# Patient Record
Sex: Female | Born: 1980 | Race: White | Hispanic: No | Marital: Married | Smoking: Never smoker
Health system: Southern US, Community
[De-identification: ages and names within clinical notes are randomized; demographics above are authoritative.]

## PROBLEM LIST (undated history)

## (undated) DIAGNOSIS — R768 Other specified abnormal immunological findings in serum: Secondary | ICD-10-CM

## (undated) DIAGNOSIS — R894 Abnormal immunological findings in specimens from other organs, systems and tissues: Secondary | ICD-10-CM

## (undated) DIAGNOSIS — T4145XA Adverse effect of unspecified anesthetic, initial encounter: Secondary | ICD-10-CM

## (undated) DIAGNOSIS — K219 Gastro-esophageal reflux disease without esophagitis: Secondary | ICD-10-CM

## (undated) DIAGNOSIS — R87629 Unspecified abnormal cytological findings in specimens from vagina: Secondary | ICD-10-CM

## (undated) DIAGNOSIS — Z8759 Personal history of other complications of pregnancy, childbirth and the puerperium: Secondary | ICD-10-CM

## (undated) DIAGNOSIS — T8859XA Other complications of anesthesia, initial encounter: Secondary | ICD-10-CM

## (undated) DIAGNOSIS — D649 Anemia, unspecified: Secondary | ICD-10-CM

## (undated) HISTORY — DX: Unspecified abnormal cytological findings in specimens from vagina: R87.629

## (undated) HISTORY — PX: BREAST LUMPECTOMY: SHX2

## (undated) HISTORY — DX: Personal history of other complications of pregnancy, childbirth and the puerperium: Z87.59

## (undated) HISTORY — PX: EXTERNAL EAR SURGERY: SHX627

## (undated) HISTORY — PX: LEEP: SHX91

---

## 2014-12-27 NOTE — L&D Delivery Note (Signed)
Delivery Note At 8:17 AM a viable female was delivered via Vaginal, Spontaneous Delivery (Presentation: Left Occiput Anterior).  APGAR: 7, 9; weight  .  Pending. Nuchal x 1 reduced. Terminal Bradycardia.  Placenta status: Intact, Manual removal after 20 minutes with no spontaneous delivery and concern for cervical contraction.  Cord: 3VC  with the following complications: None.  Cord pH: not indicated. Some stretching of vaginal tissue but overall vagina intact. 1 small area with bleeding from small vessel, just inside introitus, controlled with single figure of 8 of 3-0 vicryl rapide. Aggressive uterine bleeding noted. Pitocin infusing.  Placenta examined, intact, uterus deep in pelvic and firm, straight cath with <50 cc urine. No temp. 25 mcg hemabate given, still bleeding at 5-10 min, 1000 mcg rectal cytotec placed. Vaginal wall retractors placed and cervix examined- no lacerations. After about 20 minutes, bleeding slowed.   Anesthesia: Epidural  Episiotomy: None Lacerations:  1st degree Suture Repair: 3.0 vicryl rapide Est. Blood Loss (mL):  700cc  Mom to postpartum.  Baby to Couplet care / Skin to Skin.  Jacorian Golaszewski A. 10/03/2015, 9:07 AM

## 2015-02-27 LAB — OB RESULTS CONSOLE ABO/RH: RH Type: NEGATIVE

## 2015-03-14 LAB — OB RESULTS CONSOLE GC/CHLAMYDIA
CHLAMYDIA, DNA PROBE: NEGATIVE
GC PROBE AMP, GENITAL: NEGATIVE

## 2015-03-14 LAB — OB RESULTS CONSOLE RPR: RPR: NONREACTIVE

## 2015-03-14 LAB — OB RESULTS CONSOLE HEPATITIS B SURFACE ANTIGEN: Hepatitis B Surface Ag: NEGATIVE

## 2015-03-14 LAB — OB RESULTS CONSOLE RUBELLA ANTIBODY, IGM: RUBELLA: IMMUNE

## 2015-03-14 LAB — OB RESULTS CONSOLE HIV ANTIBODY (ROUTINE TESTING): HIV: NONREACTIVE

## 2015-06-23 LAB — OB RESULTS CONSOLE ANTIBODY SCREEN: ANTIBODY SCREEN: NEGATIVE

## 2015-09-19 LAB — OB RESULTS CONSOLE GBS: GBS: NEGATIVE

## 2015-10-01 ENCOUNTER — Encounter (HOSPITAL_COMMUNITY): Payer: Self-pay

## 2015-10-01 ENCOUNTER — Inpatient Hospital Stay (HOSPITAL_COMMUNITY)
Admission: AD | Admit: 2015-10-01 | Discharge: 2015-10-05 | DRG: 774 | Disposition: A | Discharge status: Home / Self Care | Payer: BC Managed Care – PPO | Source: Ambulatory Visit | Attending: Obstetrics | Admitting: Obstetrics

## 2015-10-01 DIAGNOSIS — Z3A39 39 weeks gestation of pregnancy: Secondary | ICD-10-CM

## 2015-10-01 DIAGNOSIS — Z3490 Encounter for supervision of normal pregnancy, unspecified, unspecified trimester: Secondary | ICD-10-CM

## 2015-10-01 DIAGNOSIS — K219 Gastro-esophageal reflux disease without esophagitis: Secondary | ICD-10-CM | POA: Diagnosis present

## 2015-10-01 DIAGNOSIS — O9962 Diseases of the digestive system complicating childbirth: Secondary | ICD-10-CM | POA: Diagnosis present

## 2015-10-01 DIAGNOSIS — D62 Acute posthemorrhagic anemia: Secondary | ICD-10-CM | POA: Diagnosis not present

## 2015-10-01 DIAGNOSIS — O9081 Anemia of the puerperium: Secondary | ICD-10-CM | POA: Diagnosis not present

## 2015-10-01 DIAGNOSIS — O134 Gestational [pregnancy-induced] hypertension without significant proteinuria, complicating childbirth: Principal | ICD-10-CM | POA: Diagnosis present

## 2015-10-01 HISTORY — DX: Other complications of anesthesia, initial encounter: T88.59XA

## 2015-10-01 HISTORY — DX: Anemia, unspecified: D64.9

## 2015-10-01 HISTORY — DX: Adverse effect of unspecified anesthetic, initial encounter: T41.45XA

## 2015-10-01 HISTORY — DX: Gastro-esophageal reflux disease without esophagitis: K21.9

## 2015-10-01 LAB — CBC
HEMATOCRIT: 35.3 % — AB (ref 36.0–46.0)
HEMOGLOBIN: 12.2 g/dL (ref 12.0–15.0)
MCH: 31.4 pg (ref 26.0–34.0)
MCHC: 34.6 g/dL (ref 30.0–36.0)
MCV: 90.7 fL (ref 78.0–100.0)
Platelets: 237 10*3/uL (ref 150–400)
RBC: 3.89 MIL/uL (ref 3.87–5.11)
RDW: 13.2 % (ref 11.5–15.5)
WBC: 10.7 10*3/uL — ABNORMAL HIGH (ref 4.0–10.5)

## 2015-10-01 LAB — COMPREHENSIVE METABOLIC PANEL
ALK PHOS: 253 U/L — AB (ref 38–126)
ALT: 17 U/L (ref 14–54)
AST: 28 U/L (ref 15–41)
Albumin: 3 g/dL — ABNORMAL LOW (ref 3.5–5.0)
Anion gap: 6 (ref 5–15)
BILIRUBIN TOTAL: 0.5 mg/dL (ref 0.3–1.2)
BUN: 12 mg/dL (ref 6–20)
CALCIUM: 8.5 mg/dL — AB (ref 8.9–10.3)
CO2: 20 mmol/L — AB (ref 22–32)
CREATININE: 1.1 mg/dL — AB (ref 0.44–1.00)
Chloride: 110 mmol/L (ref 101–111)
GFR calc Af Amer: >60 mL/min (ref >60)
GFR calc non Af Amer: >60 mL/min (ref >60)
GLUCOSE: 80 mg/dL (ref 65–99)
Potassium: 4.2 mmol/L (ref 3.5–5.1)
SODIUM: 136 mmol/L (ref 135–145)
Total Protein: 6.2 g/dL — ABNORMAL LOW (ref 6.5–8.1)

## 2015-10-01 LAB — URIC ACID: Uric Acid, Serum: 8.6 mg/dL — ABNORMAL HIGH (ref 2.3–6.6)

## 2015-10-01 MED ORDER — LACTATED RINGERS IV SOLN
INTRAVENOUS | Status: DC
  Administered 2015-10-01: 125 mL/h via INTRAVENOUS
  Administered 2015-10-01 – 2015-10-02 (×2): via INTRAVENOUS
  Administered 2015-10-02: 125 mL/h via INTRAVENOUS

## 2015-10-01 MED ORDER — LACTATED RINGERS IV SOLN
500.0000 mL | INTRAVENOUS | Status: DC | PRN
  Administered 2015-10-01 – 2015-10-02 (×2): 500 mL via INTRAVENOUS

## 2015-10-01 MED ORDER — ONDANSETRON HCL 4 MG/2ML IJ SOLN
4.0000 mg | Freq: Four times a day (QID) | INTRAMUSCULAR | Status: DC | PRN
  Administered 2015-10-02: 4 mg via INTRAVENOUS
  Filled 2015-10-01: qty 2

## 2015-10-01 MED ORDER — OXYCODONE-ACETAMINOPHEN 5-325 MG PO TABS: 1.0000 | ORAL_TABLET | ORAL | Status: DC | PRN

## 2015-10-01 MED ORDER — OXYTOCIN BOLUS FROM INFUSION
500.0000 mL | INTRAVENOUS | Status: DC
  Administered 2015-10-03 (×2): 500 mL via INTRAVENOUS

## 2015-10-01 MED ORDER — LIDOCAINE HCL (PF) 1 % IJ SOLN
30.0000 mL | INTRAMUSCULAR | Status: DC | PRN
  Filled 2015-10-01: qty 30

## 2015-10-01 MED ORDER — FLEET ENEMA 7-19 GM/118ML RE ENEM: 1.0000 | ENEMA | RECTAL | Status: DC | PRN

## 2015-10-01 MED ORDER — ACETAMINOPHEN 325 MG PO TABS: 650.0000 mg | ORAL_TABLET | ORAL | Status: DC | PRN

## 2015-10-01 MED ORDER — CITRIC ACID-SODIUM CITRATE 334-500 MG/5ML PO SOLN
30.0000 mL | ORAL | Status: DC | PRN
  Administered 2015-10-02: 30 mL via ORAL
  Filled 2015-10-01: qty 15

## 2015-10-01 MED ORDER — MISOPROSTOL 25 MCG QUARTER TABLET
25.0000 ug | ORAL_TABLET | ORAL | Status: DC
  Administered 2015-10-01 (×2): 25 ug via VAGINAL
  Administered 2015-10-03: 1000 ug via VAGINAL
  Filled 2015-10-01 (×2): qty 0.25

## 2015-10-01 MED ORDER — OXYCODONE-ACETAMINOPHEN 5-325 MG PO TABS: 2.0000 | ORAL_TABLET | ORAL | Status: DC | PRN

## 2015-10-01 MED ORDER — OXYTOCIN 40 UNITS IN LACTATED RINGERS INFUSION - SIMPLE MED
62.5000 mL/h | INTRAVENOUS | Status: DC
  Administered 2015-10-03: 62.5 mL/h via INTRAVENOUS

## 2015-10-01 NOTE — H&P (Signed)
Tiffany Edwards is a 34 y.o. female presenting for IOL for gestational HTN.  Maternal Medical History:  Contractions: Onset was less than 1 hour ago.   Frequency: rare.   Perceived severity is mild.    Fetal activity: Perceived fetal activity is normal.   Last perceived fetal movement was within the past hour.    Prenatal complications: PIH.   Prenatal Complications - Diabetes: none.    OB History    Gravida Para Term Preterm AB TAB SAB Ectopic Multiple Living   2 0   1  1        Past Medical History  Diagnosis Date  . Anemia   . GERD (gastroesophageal reflux disease)   . Complication of anesthesia     reported tachycardia   Past Surgical History  Procedure Laterality Date  . Breast lumpectomy Right    Family History: family history is not on file. Social History:  reports that she has never smoked. She does not have any smokeless tobacco history on file. Her alcohol and drug histories are not on file.   Prenatal Transfer Tool  Maternal Diabetes: No Genetic Screening: Normal Maternal Ultrasounds/Referrals: Normal Fetal Ultrasounds or other Referrals:  None Maternal Substance Abuse:  No Significant Maternal Medications:  None Significant Maternal Lab Results:  None Other Comments:  None  Review of Systems  HENT: Negative.   Gastrointestinal: Negative.   Neurological: Negative.   All other systems reviewed and are negative.   Dilation: Closed Effacement (%): Thick Station: -3 Exam by:: Susy Manor, RN  Blood pressure 137/85, pulse 81, temperature 99.1 F (37.3 C), temperature source Oral, resp. rate 18, height  (1.6 m), weight 72.576 kg (160 lb), last menstrual period 12/28/2014. Maternal Exam:  Uterine Assessment: Contraction strength is mild.  Contraction frequency is rare.   Abdomen: Patient reports no abdominal tenderness. Fetal presentation: vertex  Introitus: Normal vulva. Normal vagina.  Ferning test: not done.  Nitrazine test: not  done. Amniotic fluid character: not assessed.  Pelvis: adequate for delivery.   Cervix: Cervix evaluated by digital exam.     Physical Exam  Nursing note and vitals reviewed. Constitutional: She is oriented to person, place, and time. She appears well-developed and well-nourished.  Neck: Normal range of motion. Neck supple.  Cardiovascular: Normal rate and regular rhythm.   Respiratory: Effort normal and breath sounds normal.  GI: Soft. Bowel sounds are normal.  Genitourinary: Vagina normal and uterus normal.  Musculoskeletal: Normal range of motion.  Neurological: She is alert and oriented to person, place, and time. She has normal reflexes.  Skin: Skin is warm and dry.  Psychiatric: She has a normal mood and affect. Her behavior is normal.    Prenatal labs: ABO, Rh: --/--/B NEG (10/05 1345) Antibody: POS (10/05 1345) Rubella: Immune (03/18 0000) RPR: Nonreactive (03/18 0000)  HBsAg: Negative (03/18 0000)  HIV: Non-reactive (03/18 0000)  GBS: Negative (09/23 0000)   CBC    Component Value Date/Time   WBC 10.7* 10/01/2015 1345   RBC 3.89 10/01/2015 1345   HGB 12.2 10/01/2015 1345   HCT 35.3* 10/01/2015 1345   PLT 237 10/01/2015 1345   MCV 90.7 10/01/2015 1345   MCH 31.4 10/01/2015 1345   MCHC 34.6 10/01/2015 1345   RDW 13.2 10/01/2015 1345    CMP     Component Value Date/Time   NA 136 10/01/2015 1345   K 4.2 10/01/2015 1345   CL 110 10/01/2015 1345   CO2 20* 10/01/2015 1345  GLUCOSE 80 10/01/2015 1345   BUN 12 10/01/2015 1345   CREATININE 1.10* 10/01/2015 1345   CALCIUM 8.5* 10/01/2015 1345   PROT 6.2* 10/01/2015 1345   ALBUMIN 3.0* 10/01/2015 1345   AST 28 10/01/2015 1345   ALT 17 10/01/2015 1345   ALKPHOS 253* 10/01/2015 1345   BILITOT 0.5 10/01/2015 1345   GFRNONAA >60 10/01/2015 1345   GFRAA >60 10/01/2015 1345    Reactive NST cytotec placed  Assessment/Plan: Gestational HTN- nl lab IOL   Luvina Poirier J 10/01/2015, 9:32 PM

## 2015-10-02 ENCOUNTER — Inpatient Hospital Stay (HOSPITAL_COMMUNITY): Payer: BC Managed Care – PPO | Admitting: Anesthesiology

## 2015-10-02 LAB — CBC
HCT: 34.7 % — ABNORMAL LOW (ref 36.0–46.0)
HEMOGLOBIN: 11.8 g/dL — AB (ref 12.0–15.0)
MCH: 31.2 pg (ref 26.0–34.0)
MCHC: 34 g/dL (ref 30.0–36.0)
MCV: 91.8 fL (ref 78.0–100.0)
Platelets: 205 10*3/uL (ref 150–400)
RBC: 3.78 MIL/uL — AB (ref 3.87–5.11)
RDW: 13.4 % (ref 11.5–15.5)
WBC: 11.9 10*3/uL — ABNORMAL HIGH (ref 4.0–10.5)

## 2015-10-02 LAB — COMPREHENSIVE METABOLIC PANEL
ALK PHOS: 235 U/L — AB (ref 38–126)
ALT: 18 U/L (ref 14–54)
ANION GAP: 6 (ref 5–15)
AST: 31 U/L (ref 15–41)
Albumin: 2.9 g/dL — ABNORMAL LOW (ref 3.5–5.0)
BILIRUBIN TOTAL: 1 mg/dL (ref 0.3–1.2)
BUN: 9 mg/dL (ref 6–20)
CALCIUM: 8.4 mg/dL — AB (ref 8.9–10.3)
CO2: 23 mmol/L (ref 22–32)
CREATININE: 1.01 mg/dL — AB (ref 0.44–1.00)
Chloride: 106 mmol/L (ref 101–111)
Glucose, Bld: 79 mg/dL (ref 65–99)
Potassium: 4.1 mmol/L (ref 3.5–5.1)
SODIUM: 135 mmol/L (ref 135–145)
TOTAL PROTEIN: 6.5 g/dL (ref 6.5–8.1)

## 2015-10-02 LAB — RPR: RPR: NONREACTIVE

## 2015-10-02 MED ORDER — TERBUTALINE SULFATE 1 MG/ML IJ SOLN: 0.2500 mg | Freq: Once | INTRAMUSCULAR | Status: DC | PRN

## 2015-10-02 MED ORDER — OXYTOCIN 40 UNITS IN LACTATED RINGERS INFUSION - SIMPLE MED
1.0000 m[IU]/min | INTRAVENOUS | Status: DC
  Administered 2015-10-02: 2 m[IU]/min via INTRAVENOUS
  Administered 2015-10-02: 8 m[IU]/min via INTRAVENOUS
  Administered 2015-10-02: 4 m[IU]/min via INTRAVENOUS
  Administered 2015-10-02: 6 m[IU]/min via INTRAVENOUS
  Filled 2015-10-02 (×2): qty 1000

## 2015-10-02 MED ORDER — PANTOPRAZOLE SODIUM 40 MG IV SOLR
40.0000 mg | Freq: Once | INTRAVENOUS | Status: AC
  Administered 2015-10-02: 40 mg via INTRAVENOUS
  Filled 2015-10-02: qty 40

## 2015-10-02 MED ORDER — PHENYLEPHRINE 40 MCG/ML (10ML) SYRINGE FOR IV PUSH (FOR BLOOD PRESSURE SUPPORT)
80.0000 ug | PREFILLED_SYRINGE | INTRAVENOUS | Status: DC | PRN
  Filled 2015-10-02: qty 20

## 2015-10-02 MED ORDER — LIDOCAINE HCL (PF) 1 % IJ SOLN
INTRAMUSCULAR | Status: DC | PRN
  Administered 2015-10-02 (×2): 4 mL via EPIDURAL
  Administered 2015-10-02: 2 mL via EPIDURAL

## 2015-10-02 MED ORDER — FENTANYL 2.5 MCG/ML BUPIVACAINE 1/10 % EPIDURAL INFUSION (WH - ANES)
14.0000 mL/h | INTRAMUSCULAR | Status: DC | PRN
  Administered 2015-10-02: 12 mL/h via EPIDURAL
  Administered 2015-10-02 (×2): 14 mL/h via EPIDURAL
  Filled 2015-10-02 (×3): qty 125

## 2015-10-02 MED ORDER — EPHEDRINE 5 MG/ML INJ: 10.0000 mg | INTRAVENOUS | Status: DC | PRN

## 2015-10-02 MED ORDER — FAMOTIDINE 20 MG PO TABS
20.0000 mg | ORAL_TABLET | Freq: Once | ORAL | Status: AC
  Administered 2015-10-02: 20 mg via ORAL
  Filled 2015-10-02: qty 1

## 2015-10-02 MED ORDER — BUPIVACAINE HCL (PF) 0.25 % IJ SOLN: INTRAMUSCULAR | Status: DC | PRN

## 2015-10-02 MED ORDER — DIPHENHYDRAMINE HCL 50 MG/ML IJ SOLN: 12.5000 mg | INTRAMUSCULAR | Status: DC | PRN

## 2015-10-02 NOTE — Anesthesia Preprocedure Evaluation (Signed)
Anesthesia Evaluation  Patient identified by MRN, date of birth, ID band Patient awake    Reviewed: Allergy & Precautions, H&P , NPO status , Patient's Chart, lab work & pertinent test results  Airway Mallampati: II  TM Distance: >3 FB Neck ROM: full    Dental no notable dental hx.    Pulmonary neg pulmonary ROS,    Pulmonary exam normal breath sounds clear to auscultation       Cardiovascular negative cardio ROS Normal cardiovascular exam Rhythm:regular Rate:Normal     Neuro/Psych negative neurological ROS  negative psych ROS   GI/Hepatic Neg liver ROS, GERD  ,  Endo/Other  negative endocrine ROS  Renal/GU negative Renal ROS  negative genitourinary   Musculoskeletal   Abdominal   Peds  Hematology  (+) anemia ,   Anesthesia Other Findings   Reproductive/Obstetrics (+) Pregnancy                             Anesthesia Physical Anesthesia Plan  ASA: II  Anesthesia Plan: Epidural   Post-op Pain Management:    Induction:   Airway Management Planned:   Additional Equipment:   Intra-op Plan:   Post-operative Plan:   Informed Consent: I have reviewed the patients History and Physical, chart, labs and discussed the procedure including the risks, benefits and alternatives for the proposed anesthesia with the patient or authorized representative who has indicated his/her understanding and acceptance.     Plan Discussed with:   Anesthesia Plan Comments:         Anesthesia Quick Evaluation

## 2015-10-02 NOTE — Progress Notes (Signed)
Tiffany Edwards is a 34 y.o. G2P0010 at [redacted]w[redacted]d by LMP admitted for induction of labor due to Hypertension.  Subjective: Still comfortable No HA  Objective: BP 126/63 mmHg  Pulse 59  Temp(Src) 97.4 F (36.3 C) (Oral)  Resp 18  Ht  (1.6 m)  Wt 72.576 kg (160 lb)  BMI 28.35 kg/m2  LMP 12/28/2014      FHT:  FHR: 155 bpm, variability: moderate,  accelerations:  Present,  decelerations:  Absent UC:   regular, every 2 minutes SVE:   Dilation: 1 Effacement (%): 50 Station: -3 Exam by:: MD Solaris Kram  Labs: Lab Results  Component Value Date   WBC 10.7* 10/01/2015   HGB 12.2 10/01/2015   HCT 35.3* 10/01/2015   MCV 90.7 10/01/2015   PLT 237 10/01/2015    Assessment / Plan: Induction of labor due to gestational hypertension,  progressing well on pitocin  Labor: Progressing normally and Progressing on Pitocin, will continue to increase then AROM Preeclampsia:  no signs or symptoms of toxicity, intake and ouput balanced and labs stable Fetal Wellbeing:  Category I Pain Control:  Labor support without medications I/D:  n/a Anticipated MOD:  NSVD  Clay Solum J 10/02/2015, 7:37 AM

## 2015-10-02 NOTE — Anesthesia Procedure Notes (Signed)
Epidural Patient location during procedure: OB  Staffing Anesthesiologist: Sandrika Schwinn Performed by: anesthesiologist   Preanesthetic Checklist Completed: patient identified, site marked, surgical consent, pre-op evaluation, timeout performed, IV checked, risks and benefits discussed and monitors and equipment checked  Epidural Patient position: sitting Prep: site prepped and draped and DuraPrep Patient monitoring: continuous pulse ox and blood pressure Approach: midline Location: L3-L4 Injection technique: LOR saline  Needle:  Needle type: Tuohy  Needle gauge: 17 G Needle length: 9 cm and 9 Needle insertion depth: 5 cm cm Catheter type: closed end flexible Catheter size: 19 Gauge Catheter at skin depth: 9 cm Test dose: negative  Assessment Events: blood not aspirated, injection not painful, no injection resistance, negative IV test and no paresthesia  Additional Notes Patient identified. Risks/Benefits/Options discussed with patient including but not limited to bleeding, infection, nerve damage, paralysis, failed block, incomplete pain control, headache, blood pressure changes, nausea, vomiting, reactions to medications, itching and postpartum back pain. Confirmed with bedside nurse the patient's most recent platelet count. Confirmed with patient that they are not currently taking any anticoagulation, have any bleeding history or any family history of bleeding disorders. Patient expressed understanding and wished to proceed. All questions were answered. Sterile technique was used throughout the entire procedure. Please see nursing notes for vital signs. Test dose was given through epidural catheter and negative prior to continuing to dose epidural or start infusion. Warning signs of high block given to the patient including shortness of breath, tingling/numbness in hands, complete motor block, or any concerning symptoms with instructions to call for help. Patient was given  instructions on fall risk and not to get out of bed. All questions and concerns addressed with instructions to call with any issues or inadequate analgesia.      

## 2015-10-02 NOTE — Progress Notes (Signed)
S: Doing well, no complaints, pain well controlled with epidural though pt now noting significant heartburn, not responding to po pepcid and bicitrate. No current HA.   O: BP 140/85 mmHg  Pulse 60  Temp(Src) 98 F (36.7 C) (Oral)  Resp 18  Ht  (1.6 m)  Wt 72.576 kg (160 lb)  BMI 28.35 kg/m2  SpO2 100%  LMP 12/28/2014   FHT:  FHR: 140s bpm, variability: moderate,  accelerations:  Present,  decelerations:  Absent UC:   regular, every 3 minutes pit at 20 munits/ min SVE:   Dilation: 2 Effacement (%): 50 Station: -3 Exam by:: dr Ernestina Penna  AROM clear   A / P:  34 y.o.  Obstetric History   G2   P0   T0   P0   A1   TAB0   SAB1   E0   M0   L0    at [redacted]w[redacted]d IOL for g est htn with HA, no current HA, bp's slightly elevated but overall stable. Cr up to 1.1 but down to 1.01 on HD#2  Fetal Wellbeing:  Category I Pain Control:  Epidural  Anticipated MOD:  NSVD  Tiffany Edwards A. 10/02/2015, 9:15 PM

## 2015-10-02 NOTE — Progress Notes (Signed)
S: Doing well, no complaints, pain well controlled with epidural. Pt notes no HA.  O: BP 120/84 mmHg  Pulse 85  Temp(Src) 98.3 F (36.8 C) (Oral)  Resp 18  Ht  (1.6 m)  Wt 72.576 kg (160 lb)  BMI 28.35 kg/m2  SpO2 100%  LMP 12/28/2014   FHT:  FHR: 130s bpm, variability: moderate,  accelerations:  Present,  decelerations:  Absent UC:   regular, every 3 minutes SVE:   Dilation: 2 Effacement (%): 50 Station: -3 Exam by:: Doloris Hall RN   A / P:  34 y.o.  Obstetric History   G2   P0   T0   P0   A1   TAB0   SAB1   E0   M0   L0    at [redacted]w[redacted]d IOL, gest htn, still latent phase, continue pitocin  Fetal Wellbeing:  Category I Pain Control:  Epidural  Anticipated MOD:  NSVD  Tiffany Edwards A. 10/02/2015, 5:03 PM

## 2015-10-02 NOTE — Progress Notes (Signed)
S: Doing well, no complaints, pain well controlled, not really feeling contractions, still latent phase, planning epidural. Pt notes resolution of HA since admission  O: BP 126/77 mmHg  Pulse 78  Temp(Src) 97.4 F (36.3 C) (Oral)  Resp 18  Ht  (1.6 m)  Wt 72.576 kg (160 lb)  BMI 28.35 kg/m2  LMP 12/28/2014   FHT:  FHR: 140s bpm, variability: moderate,  accelerations:  Present,  decelerations:  Absent UC:   regular, every 3 minutes SVE:   Dilation: 2 Effacement (%): 50 Station: -3 Exam by:: Dr. Ernestina Penna   A / P:  34 y.o.  Obstetric History   G2   P0   T0   P0   A1   TAB0   SAB1   E0   M0   L0    at [redacted]w[redacted]d IOL due to gestational htn (bp >140/90 x 2 in office) with HA and Cr 1.1  Fetal Wellbeing:  Category I Pain Control:  Labor support without medications  Anticipated MOD:  NSVD  Continue pitocin until active labor. AROM in active labor.   Dametri Ozburn A. 10/02/2015, 1:24 PM

## 2015-10-03 ENCOUNTER — Encounter (HOSPITAL_COMMUNITY): Payer: Self-pay

## 2015-10-03 LAB — COMPREHENSIVE METABOLIC PANEL
ALT: 17 U/L (ref 14–54)
AST: 43 U/L — ABNORMAL HIGH (ref 15–41)
Albumin: 2.1 g/dL — ABNORMAL LOW (ref 3.5–5.0)
Alkaline Phosphatase: 184 U/L — ABNORMAL HIGH (ref 38–126)
Anion gap: 8 (ref 5–15)
BUN: 10 mg/dL (ref 6–20)
CHLORIDE: 103 mmol/L (ref 101–111)
CO2: 20 mmol/L — ABNORMAL LOW (ref 22–32)
Calcium: 7.7 mg/dL — ABNORMAL LOW (ref 8.9–10.3)
Creatinine, Ser: 1.27 mg/dL — ABNORMAL HIGH (ref 0.44–1.00)
GFR, EST NON AFRICAN AMERICAN: 55 mL/min — AB (ref >60)
Glucose, Bld: 132 mg/dL — ABNORMAL HIGH (ref 65–99)
POTASSIUM: 3.6 mmol/L (ref 3.5–5.1)
Sodium: 131 mmol/L — ABNORMAL LOW (ref 135–145)
Total Bilirubin: 0.9 mg/dL (ref 0.3–1.2)
Total Protein: 5.1 g/dL — ABNORMAL LOW (ref 6.5–8.1)

## 2015-10-03 LAB — URIC ACID: URIC ACID, SERUM: 8.4 mg/dL — AB (ref 2.3–6.6)

## 2015-10-03 MED ORDER — LANOLIN HYDROUS EX OINT: TOPICAL_OINTMENT | CUTANEOUS | Status: DC | PRN

## 2015-10-03 MED ORDER — SIMETHICONE 80 MG PO CHEW
80.0000 mg | CHEWABLE_TABLET | ORAL | Status: DC | PRN
Start: 2015-10-03 — End: 2015-10-05

## 2015-10-03 MED ORDER — ONDANSETRON HCL 4 MG/2ML IJ SOLN: 4.0000 mg | INTRAMUSCULAR | Status: DC | PRN

## 2015-10-03 MED ORDER — FLEET ENEMA 7-19 GM/118ML RE ENEM: 1.0000 | ENEMA | Freq: Every day | RECTAL | Status: DC | PRN

## 2015-10-03 MED ORDER — ZOLPIDEM TARTRATE 5 MG PO TABS: 5.0000 mg | ORAL_TABLET | Freq: Every evening | ORAL | Status: DC | PRN

## 2015-10-03 MED ORDER — FAMOTIDINE 20 MG PO TABS
40.0000 mg | ORAL_TABLET | Freq: Every day | ORAL | Status: DC
  Filled 2015-10-03: qty 2

## 2015-10-03 MED ORDER — ACETAMINOPHEN 325 MG PO TABS: 650.0000 mg | ORAL_TABLET | ORAL | Status: DC | PRN

## 2015-10-03 MED ORDER — MISOPROSTOL 200 MCG PO TABS
ORAL_TABLET | ORAL | Status: AC
  Filled 2015-10-03: qty 5

## 2015-10-03 MED ORDER — SENNOSIDES-DOCUSATE SODIUM 8.6-50 MG PO TABS
2.0000 | ORAL_TABLET | ORAL | Status: DC
  Administered 2015-10-04 – 2015-10-05 (×2): 2 via ORAL
  Filled 2015-10-03 (×2): qty 2

## 2015-10-03 MED ORDER — MISOPROSTOL 200 MCG PO TABS: 1000.0000 ug | ORAL_TABLET | Freq: Once | ORAL | Status: DC

## 2015-10-03 MED ORDER — TETANUS-DIPHTH-ACELL PERTUSSIS 5-2.5-18.5 LF-MCG/0.5 IM SUSP: 0.5000 mL | Freq: Once | INTRAMUSCULAR | Status: DC

## 2015-10-03 MED ORDER — CARBOPROST TROMETHAMINE 250 MCG/ML IM SOLN
INTRAMUSCULAR | Status: AC
  Filled 2015-10-03: qty 1

## 2015-10-03 MED ORDER — SODIUM CHLORIDE 0.9 % IV SOLN
3.0000 g | Freq: Once | INTRAVENOUS | Status: AC
  Administered 2015-10-03: 3 g via INTRAVENOUS
  Filled 2015-10-03: qty 3

## 2015-10-03 MED ORDER — OXYCODONE-ACETAMINOPHEN 5-325 MG PO TABS: 2.0000 | ORAL_TABLET | ORAL | Status: DC | PRN

## 2015-10-03 MED ORDER — PRENATAL MULTIVITAMIN CH
1.0000 | ORAL_TABLET | Freq: Every day | ORAL | Status: DC
  Administered 2015-10-04: 1 via ORAL
  Filled 2015-10-03: qty 1

## 2015-10-03 MED ORDER — CARBOPROST TROMETHAMINE 250 MCG/ML IM SOLN
250.0000 ug | Freq: Once | INTRAMUSCULAR | Status: AC
  Administered 2015-10-03: 250 ug via INTRAMUSCULAR

## 2015-10-03 MED ORDER — BISACODYL 10 MG RE SUPP: 10.0000 mg | Freq: Every day | RECTAL | Status: DC | PRN

## 2015-10-03 MED ORDER — ONDANSETRON HCL 4 MG PO TABS: 4.0000 mg | ORAL_TABLET | ORAL | Status: DC | PRN

## 2015-10-03 MED ORDER — DIPHENHYDRAMINE HCL 25 MG PO CAPS: 25.0000 mg | ORAL_CAPSULE | Freq: Four times a day (QID) | ORAL | Status: DC | PRN

## 2015-10-03 MED ORDER — DIBUCAINE 1 % RE OINT: 1.0000 "application " | TOPICAL_OINTMENT | RECTAL | Status: DC | PRN

## 2015-10-03 MED ORDER — WITCH HAZEL-GLYCERIN EX PADS: 1.0000 "application " | MEDICATED_PAD | CUTANEOUS | Status: DC | PRN

## 2015-10-03 MED ORDER — OXYCODONE-ACETAMINOPHEN 5-325 MG PO TABS
1.0000 | ORAL_TABLET | ORAL | Status: DC | PRN
  Filled 2015-10-03: qty 1

## 2015-10-03 MED ORDER — IBUPROFEN 600 MG PO TABS
600.0000 mg | ORAL_TABLET | Freq: Four times a day (QID) | ORAL | Status: DC
  Administered 2015-10-03 – 2015-10-04 (×4): 600 mg via ORAL
  Filled 2015-10-03 (×4): qty 1

## 2015-10-03 MED ORDER — BENZOCAINE-MENTHOL 20-0.5 % EX AERO
1.0000 "application " | INHALATION_SPRAY | CUTANEOUS | Status: DC | PRN
  Filled 2015-10-03: qty 56

## 2015-10-03 NOTE — Lactation Note (Signed)
This note was copied from the chart of Tiffany Edwards. Lactation Consultation Note  Patient Name: Tiffany Edwards [id number] Date: 10/03/2015 Reason for consult: Initial assessment  Baby 10 hours old. Mom states baby has latched, but baby sleepy at breast. Enc mom to undress baby to nurse STS. Baby alert but fussy at breast. Baby willing to suckle this LC's gloved finger, but would not latch to breast. Demonstrated how to use pillows and get into football position. Baby interested in nuzzling and licking breast, but would not latch. Mom able to hand express colostrum. Discussed normal newborn behavior and enc mom to ask for assistance as baby cues to nurse. Enc offering lots of STS and attempts at breast. Mom given [facility] brochure, aware of OP/BFSG, community resources, and [facility] phone line assistance after D/C.  Maternal Data Has patient been taught Hand Expression?: Yes Does the patient have breastfeeding experience prior to this delivery?: No  Feeding Feeding Type: Breast Fed Length of feed: 5 min  LATCH Score/Interventions Latch: Too sleepy or reluctant, no latch achieved, no sucking elicited. Intervention(s): Skin to skin;Teach feeding cues;Waking techniques  Audible Swallowing: None Intervention(s): Skin to skin;Hand expression  Type of Nipple: Everted at rest and after stimulation  Comfort (Breast/Nipple): Soft / non-tender     Hold (Positioning): Assistance needed to correctly position infant at breast and maintain latch.  LATCH Score: 5  Lactation Tools Discussed/Used     Consult Status Consult Status: Follow-up Date: 10/04/15 Follow-up type: In-patient    Geralynn Ochs 10/03/2015, 6:44 PM

## 2015-10-03 NOTE — Progress Notes (Signed)
S: Doing well, no complaints, pain well controlled with epidural. No HA, some LE swelling, heartburn improved but still present.  O: BP 128/70 mmHg  Pulse 78  Temp(Src) 98.3 F (36.8 C) (Oral)  Resp 18  Ht  (1.6 m)  Wt 72.576 kg (160 lb)  BMI 28.35 kg/m2  SpO2 99%  LMP 12/28/2014   FHT:  FHR: 130s bpm, variability: moderate,  accelerations:  Present,  decelerations:  Absent UC:   regular, every 3 minutes SVE:   Dilation: 9 Effacement (%): 100 Station: 0 Exam by:: dr Ernestina Penna   A / P:  34 y.o.  Obstetric History   G2   P0   T0   P0   A1   TAB0   SAB1   E0   M0   L0    at [redacted]w[redacted]d IOL gest htn, bp's stable, no symptoms PEC at present. Now active labor, good progress, allow pasive descent, expect pushing in about 1 hr  Fetal Wellbeing:  Category I Pain Control:  Epidural  Anticipated MOD:  NSVD  Tiffany Edwards A. 10/03/2015, 1:50 AM

## 2015-10-04 DIAGNOSIS — D62 Acute posthemorrhagic anemia: Secondary | ICD-10-CM | POA: Diagnosis not present

## 2015-10-04 LAB — CBC
HEMATOCRIT: 23.6 % — AB (ref 36.0–46.0)
Hemoglobin: 8.2 g/dL — ABNORMAL LOW (ref 12.0–15.0)
MCH: 31.4 pg (ref 26.0–34.0)
MCHC: 34.7 g/dL (ref 30.0–36.0)
MCV: 90.4 fL (ref 78.0–100.0)
PLATELETS: 156 10*3/uL (ref 150–400)
RBC: 2.61 MIL/uL — ABNORMAL LOW (ref 3.87–5.11)
RDW: 13.5 % (ref 11.5–15.5)
WBC: 17 10*3/uL — ABNORMAL HIGH (ref 4.0–10.5)

## 2015-10-04 MED ORDER — POLYSACCHARIDE IRON COMPLEX 150 MG PO CAPS
150.0000 mg | ORAL_CAPSULE | Freq: Every day | ORAL | Status: DC
  Administered 2015-10-04: 150 mg via ORAL
  Filled 2015-10-04: qty 1

## 2015-10-04 MED ORDER — MAGNESIUM OXIDE 400 (241.3 MG) MG PO TABS
400.0000 mg | ORAL_TABLET | Freq: Every day | ORAL | Status: DC
  Administered 2015-10-04: 400 mg via ORAL
  Filled 2015-10-04 (×2): qty 1

## 2015-10-04 MED ORDER — RHO D IMMUNE GLOBULIN 1500 UNIT/2ML IJ SOSY
300.0000 ug | PREFILLED_SYRINGE | Freq: Once | INTRAMUSCULAR | Status: AC
  Administered 2015-10-04: 300 ug via INTRAVENOUS
  Filled 2015-10-04: qty 2

## 2015-10-04 MED ORDER — IBUPROFEN 800 MG PO TABS
800.0000 mg | ORAL_TABLET | Freq: Three times a day (TID) | ORAL | Status: DC
  Administered 2015-10-04 – 2015-10-05 (×3): 800 mg via ORAL
  Filled 2015-10-04 (×3): qty 1

## 2015-10-04 NOTE — Anesthesia Postprocedure Evaluation (Signed)
  Anesthesia Post Note  Patient: Tiffany Edwards  Procedure(s) Performed: * No procedures listed *  Anesthesia type: Epidural  Patient location: Mother/Baby  Post pain: Pain level controlled  Post assessment: Post-op Vital signs reviewed  Last Vitals:  Filed Vitals:   10/04/15 0522  BP: 121/66  Pulse: 75  Temp: 36.9 C  Resp: 20    Post vital signs: Reviewed  Level of consciousness: awake  Complications: No apparent anesthesia complications

## 2015-10-04 NOTE — Lactation Note (Signed)
This note was copied from the chart of Tiffany Edwards. Lactation Consultation Note  Patient Name: Tiffany Edwards [id number] Date: 10/04/2015 Reason for consult: Follow-up assessment;Other (Comment) (sleepy baby)   Follow up with 37 hour old infant. Baby with 3 BF for 10-30 minutes, 5 attempts, 2 voids and 1 stool and 1 % weight loss in last 24 hours. Per mom and dad infant had just fed for 25 minutes prior to visit. Mom reports he was very sleepy and requires stimulation to stay awake. Mom reports that she is compressing and massaging with feeding. She is able to hand express gtts of milk. She reports no nipple tenderness. Mom has a DEBP set up in room, she has not been pumping. Due to sleepiness and noted Jaundice, Enc her to attempt to awaken baby to feed q 2-3 hours followed by DEBP for stimulation followed by hand expression. Mom hand expressed colostrum earlier and fed to infant via spoon. Gave her curved tip syringe and colostrum collection containers and enc her to give any EBM to baby via spoon or curved tip syringe. Parents voiced understanding of plan. Will Follow up tomorrow or PRN.   Maternal Data Formula Feeding for Exclusion: No Has patient been taught Hand Expression?: Yes Does the patient have breastfeeding experience prior to this delivery?: No  Feeding    LATCH Score/Interventions                      Lactation Tools Discussed/Used Pump Review: Setup, frequency, and cleaning   Consult Status      Ed Blalock 10/04/2015, 9:38 PM

## 2015-10-04 NOTE — Progress Notes (Signed)
Dr. Ernestina Penna aware of labs on the 7th and all current BP. See orders.

## 2015-10-04 NOTE — Progress Notes (Signed)
PPD 1 SVD  S:  Reports feeling tired with sore muscles and cramps today             Tolerating po/ No nausea or vomiting             Bleeding is light             Pain controlled with motrin and percocet             Up ad lib / ambulatory / voiding QS  Newborn breast feeding  / Circumcision planned today O:               VS: BP 121/66 mmHg  Pulse 75  Temp(Src) 98.4 F (36.9 C) (Oral)  Resp 20  Ht  (1.6 m)  Wt 72.576 kg (160 lb)  BMI 28.35 kg/m2  SpO2 100%  LMP 12/28/2014  Breastfeeding? Unknown   LABS:              Recent Labs  10/02/15 1410 10/04/15 0604  WBC 11.9* 17.0*  HGB 11.8* 8.2*  PLT 205 156               Blood type: --/--/B NEG (10/05 1345)  Baby RH + / rhogam planned today  Rubella: Immune (03/18 0000)                     I&O: Intake/Output      10/07 0701 - 10/08 0700 10/08 0701 - 10/09 0700   Urine (mL/kg/hr) 900 (0.5)    Stool 0 (0)    Blood 700 (0.4)    Total Output 1600     Net -1600          Urine Occurrence 1 x    Stool Occurrence 1 x                  Physical Exam:             Alert and oriented X3  Abdomen: soft, non-tender, non-distended              Fundus: firm, non-tender, U-1  Perineum: no edema  Lochia: light  Extremities: 1+ pedal edema, no calf pain or tenderness    A: PPD # 1              ABL anemia - stable  Doing well - stable status  P: Routine post partum orders             Start iron and magnesium / increase motrin dose / recommend k-pad  DC tomorrow  Marlinda Mike CNM, MSN, FACNM 10/04/2015, 9:52 AM

## 2015-10-04 NOTE — Addendum Note (Signed)
Addendum  created 10/04/15 1412 by Jhonnie Garner, CRNA   Modules edited: Charges VN, Notes Section   Notes Section:  File: [id number]

## 2015-10-05 LAB — TYPE AND SCREEN
ABO/RH(D): B NEG
Antibody Screen: POSITIVE
DAT, IgG: NEGATIVE
UNIT DIVISION: 0
UNIT DIVISION: 0

## 2015-10-05 LAB — COMPREHENSIVE METABOLIC PANEL
ALT: 19 U/L (ref 14–54)
ANION GAP: 4 — AB (ref 5–15)
AST: 32 U/L (ref 15–41)
Albumin: 2.3 g/dL — ABNORMAL LOW (ref 3.5–5.0)
Alkaline Phosphatase: 138 U/L — ABNORMAL HIGH (ref 38–126)
BUN: 16 mg/dL (ref 6–20)
CHLORIDE: 109 mmol/L (ref 101–111)
CO2: 25 mmol/L (ref 22–32)
CREATININE: 0.97 mg/dL (ref 0.44–1.00)
Calcium: 8 mg/dL — ABNORMAL LOW (ref 8.9–10.3)
Glucose, Bld: 67 mg/dL (ref 65–99)
POTASSIUM: 3.7 mmol/L (ref 3.5–5.1)
SODIUM: 138 mmol/L (ref 135–145)
Total Bilirubin: 0.5 mg/dL (ref 0.3–1.2)
Total Protein: 5.4 g/dL — ABNORMAL LOW (ref 6.5–8.1)

## 2015-10-05 LAB — CBC
HCT: 23.8 % — ABNORMAL LOW (ref 36.0–46.0)
Hemoglobin: 8.1 g/dL — ABNORMAL LOW (ref 12.0–15.0)
MCH: 31 pg (ref 26.0–34.0)
MCHC: 34 g/dL (ref 30.0–36.0)
MCV: 91.2 fL (ref 78.0–100.0)
PLATELETS: 197 10*3/uL (ref 150–400)
RBC: 2.61 MIL/uL — AB (ref 3.87–5.11)
RDW: 13.5 % (ref 11.5–15.5)
WBC: 14 10*3/uL — ABNORMAL HIGH (ref 4.0–10.5)

## 2015-10-05 LAB — RH IG WORKUP (INCLUDES ABO/RH)
ABO/RH(D): B NEG
Fetal Screen: NEGATIVE
Gestational Age(Wks): 39
UNIT DIVISION: 0

## 2015-10-05 MED ORDER — IBUPROFEN 800 MG PO TABS: 800.0000 mg | ORAL_TABLET | Freq: Three times a day (TID) | ORAL | Status: DC

## 2015-10-05 MED ORDER — OXYCODONE-ACETAMINOPHEN 5-325 MG PO TABS: 1.0000 | ORAL_TABLET | ORAL | Status: DC | PRN

## 2015-10-05 MED ORDER — POLYSACCHARIDE IRON COMPLEX 150 MG PO CAPS: 150.0000 mg | ORAL_CAPSULE | Freq: Every day | ORAL | Status: DC

## 2015-10-05 NOTE — Progress Notes (Signed)
PPD# 2 s/p SVD after IOL for gest htn with intrapartum PPH  Pt note no HA, no vision change, no RUQ pain. Some neck strain from pushing. No CP, no SOB. Bleeding light, no fevers. Baby breastfeeding and milk production increasing.   Filed Vitals:   10/04/15 1816 10/04/15 2210 10/05/15 0210 10/05/15 0700  BP: 122/64 134/82 117/72 130/81  Pulse: 72 74 63 73  Temp: 98.4 F (36.9 C) 98.1 F (36.7 C) 98 F (36.7 C) 98.4 F (36.9 C)  TempSrc: Oral Oral Oral   Resp: Height:      Weight:      SpO2: 99%      Gen: well appearing, no distress CV: RRR Pulm: CTAB Abd: no RUQ pain, no fundal pain, fundus below umbilicus and firm LE: 2+ DTR, trace edema, no clonus  CBC    Component Value Date/Time   WBC 14.0* 10/05/2015 0645   RBC 2.61* 10/05/2015 0645   HGB 8.1* 10/05/2015 0645   HCT 23.8* 10/05/2015 0645   PLT 197 10/05/2015 0645   MCV 91.2 10/05/2015 0645   MCH 31.0 10/05/2015 0645   MCHC 34.0 10/05/2015 0645   RDW 13.5 10/05/2015 0645     CMP     Component Value Date/Time   NA 138 10/05/2015 0645   K 3.7 10/05/2015 0645   CL 109 10/05/2015 0645   CO2 25 10/05/2015 0645   GLUCOSE 67 10/05/2015 0645   BUN 16 10/05/2015 0645   CREATININE 0.97 10/05/2015 0645   CALCIUM 8.0* 10/05/2015 0645   PROT 5.4* 10/05/2015 0645   ALBUMIN 2.3* 10/05/2015 0645   AST 32 10/05/2015 0645   ALT 19 10/05/2015 0645   ALKPHOS 138* 10/05/2015 0645   BILITOT 0.5 10/05/2015 0645   GFRNONAA >60 10/05/2015 0645   GFRAA >60 10/05/2015 0645    A/P: 34 yo G1 PP#2 s/p SVD with PPH and gest htn.  - PPH. No further bleeding, s/p pitocin, cytotec, hemabate. Iron at d/c, will give Ferralet script - gest htn. Bp stable, exam without worrisome findings and labs improved. Watch bp for the next 4 hrs and if remains stable, OK to d/c home, plan bp check in office this week. - PP. Overall well. - consent for circ,.

## 2015-10-05 NOTE — Lactation Note (Signed)
This note was copied from the chart of Tiffany Edwards. Lactation Consultation Note  Baby latched in cross cradle hold.  Sucks and a few swallows observed. Encouraged her to compress breast to keep him active. Mother is only using nipple shield on left breast. Encouraged her to try latching at least once a day without nipple shield until he can latch without. Since he is using nipple shield suggest mother post pump a few times a day and give baby back volume pumped. She can give volume pumped back to baby. Mother has personal DEBP. Reviewed engorgement care and monitoring voids/stools.    Patient Name: Tiffany Danissa Rundle [id number] Date: 10/05/2015 Reason for consult: Follow-up assessment   Maternal Data    Feeding Feeding Type: Breast Fed Length of feed: 25 min  LATCH Score/Interventions Latch: Repeated attempts needed to sustain latch, nipple held in mouth throughout feeding, stimulation needed to elicit sucking reflex. (latched upon entering, more diificult time on left ) Intervention(s): Skin to skin;Waking techniques  Audible Swallowing: A few with stimulation Intervention(s): Skin to skin;Hand expression  Type of Nipple: Everted at rest and after stimulation  Comfort (Breast/Nipple): Filling, red/small blisters or bruises, mild/mod discomfort     Hold (Positioning): No assistance needed to correctly position infant at breast.  LATCH Score: 7  Lactation Tools Discussed/Used Tools: Nipple Shields Nipple shield size: 20   Consult Status Consult Status: Complete    Hardie Pulley 10/05/2015, 10:33 AM

## 2015-10-05 NOTE — Discharge Summary (Signed)
Obstetric Discharge Summary  Reason for Admission: induction of labor - PIH Prenatal Procedures: NST, ultrasound and labs Intrapartum Procedures: spontaneous vaginal delivery Postpartum Procedures: Rho(D) Ig Complications-Operative and Postpartum: 2nd degree perineal laceration / PPH with atony HEMOGLOBIN  Date Value Ref Range Status  10/05/2015 8.1* 12.0 - 15.0 g/dL Final   HCT  Date Value Ref Range Status  10/05/2015 23.8* 36.0 - 46.0 % Final    Physical Exam:  General: alert, cooperative and no distress Lochia: appropriate Uterine Fundus: firm Incision: healing well DVT Evaluation: No evidence of DVT seen on physical exam.  Discharge Diagnoses: Term Pregnancy-delivered / PIH / s/p PPH-stable / ABL anemia  Discharge Information: Date: 10/05/2015 Activity: pelvic rest Diet: routine Medications: PNV, Ibuprofen, Percocet and magensium and iron Condition: stable Instructions: refer to practice specific booklet Discharge to: home Follow-up Information    Follow up with [facility] A., MD In 1 week.   Specialty:  Obstetrics and Gynecology   Why:  BP recheck in office - nurse will call your MOnday to scehdule BP check and 6week exam   Contact information:   [address] [place] [facility] [address] [phone]       Newborn Data: Live born female  Birth Weight: 8 lb 6.7 oz (3819 g) APGAR: 7, 9  Home with mother.  Marlinda Mike 10/05/2015, 8:32 AM

## 2015-10-05 NOTE — Progress Notes (Signed)
Infant unable to sustain a latch on left nipple. After several attempts # 16 nipple shield tried and infant nursed for 17 minutes. Mother taught  how to place nipple shield in correct position. Also instructed to look for colostrum in the shield after infant nurses.

## 2017-01-16 ENCOUNTER — Encounter: Payer: Self-pay | Admitting: Emergency Medicine

## 2017-01-16 ENCOUNTER — Ambulatory Visit
Admission: EM | Admit: 2017-01-16 | Discharge: 2017-01-16 | Disposition: A | Discharge status: Home / Self Care | Payer: BLUE CROSS/BLUE SHIELD | Attending: Family Medicine | Admitting: Family Medicine

## 2017-01-16 DIAGNOSIS — J111 Influenza due to unidentified influenza virus with other respiratory manifestations: Secondary | ICD-10-CM

## 2017-01-16 LAB — RAPID INFLUENZA A&B ANTIGENS (ARMC ONLY): INFLUENZA A (ARMC): POSITIVE — AB

## 2017-01-16 LAB — RAPID INFLUENZA A&B ANTIGENS: Influenza B (ARMC): NEGATIVE

## 2017-01-16 MED ORDER — OSELTAMIVIR PHOSPHATE 75 MG PO CAPS: 75.0000 mg | ORAL_CAPSULE | Freq: Two times a day (BID) | ORAL | 0 refills | Status: DC

## 2017-01-16 NOTE — ED Provider Notes (Signed)
[facility]    CSN: [id number] Arrival date & time: 01/16/17  1548  History   Chief Complaint Chief Complaint  Patient presents with  . Influenza    HPI Tiffany Edwards is a 36 y.o. female.   HPI  36 year old female presents with concerns for influenza.  Patient states she's been sick since Friday. She's had body aches, fever, chills, and severe cough. Her son has influenza. He tested positive earlier this week. No known exacerbating or relieving factors. Symptoms are severe. No other associated symptoms. No other complaints or concerns at this time. She states that she did get a flu shot this year.  Past Medical History:  Diagnosis Date  . Anemia   . Complication of anesthesia    reported tachycardia  . GERD (gastroesophageal reflux disease)     Patient Active Problem List   Diagnosis Date Noted  . Acute blood loss anemia 10/04/2015  . Postpartum care following vaginal delivery (10/7) 10/03/2015  . Postpartum hemorrhage 10/03/2015    Past Surgical History:  Procedure Laterality Date  . BREAST LUMPECTOMY Right     OB History    Gravida Para Term Preterm AB Living   2 1 1   1 1    SAB TAB Ectopic Multiple Live Births   1     0 1       Home Medications    Prior to Admission medications   Medication Sig Start Date End Date Taking? Authorizing Provider  acetaminophen (TYLENOL) 325 MG tablet Take 650 mg by mouth every 6 (six) hours as needed for mild pain or headache.   Yes Historical Provider, MD  ibuprofen (ADVIL,MOTRIN) 800 MG tablet Take 1 tablet (800 mg total) by mouth every 8 (eight) hours. 10/05/15  Yes Marlinda Mikeanya Bailey, CNM  iron polysaccharides (NIFEREX) 150 MG capsule Take 1 capsule (150 mg total) by mouth daily. 10/05/15   Marlinda Mikeanya Bailey, CNM  Magnesium 200 MG TABS Take 400 mg by mouth daily.    Historical Provider, MD  oseltamivir (TAMIFLU) 75 MG capsule Take 1 capsule (75 mg total) by mouth every 12 (twelve) hours. 01/16/17   Tommie SamsJayce G Maydell Knoebel, DO    oxyCODONE-acetaminophen (PERCOCET/ROXICET) 5-325 MG tablet Take 1 tablet by mouth every 4 (four) hours as needed (for pain scale 4-7). 10/05/15   Marlinda Mikeanya Bailey, CNM  Prenatal Vit-Fe Fumarate-FA (PRENATAL MULTIVITAMIN) TABS tablet Take 1 tablet by mouth daily at 12 noon.    Historical Provider, MD  ranitidine (ZANTAC) 150 MG tablet Take 150 mg by mouth 2 (two) times daily.    Historical Provider, MD    Family History No pertinent family hx.  Social History Social History  Substance Use Topics  . Smoking status: Never Smoker  . Smokeless tobacco: Never Used  . Alcohol use Not on file   Allergies   Patient has no known allergies.   Review of Systems Review of Systems  Constitutional: Positive for chills and fever.  Respiratory: Positive for cough.   Musculoskeletal:       Body aches.  All other systems reviewed and are negative.  Physical Exam Triage Vital Signs ED Triage Vitals  Enc Vitals Group     BP 01/16/17 1639 127/78     Pulse Rate 01/16/17 1639 (!) 105     Resp 01/16/17 1639 16     Temp 01/16/17 1639 99.9 F (37.7 C)     Temp Source 01/16/17 1639 Tympanic     SpO2 01/16/17 1639 100 %  Weight 01/16/17 1640 123 lb (55.8 kg)     Height 01/16/17 1640 5\' 4"  (1.626 m)     Head Circumference --      Peak Flow --      Pain Score 01/16/17 1644 4     Pain Loc --      Pain Edu? --      Excl. in GC? --    Updated Vital Signs BP 127/78 (BP Location: Left Arm)   Pulse (!) 105   Temp 99.9 F (37.7 C) (Tympanic)   Resp 16   Ht 5\' 4"  (1.626 m)   Wt 123 lb (55.8 kg)   LMP 01/03/2017   SpO2 100%   BMI 21.11 kg/m   Physical Exam  Constitutional: She is oriented to person, place, and time. She appears well-developed. No distress.  HENT:  Head: Normocephalic and atraumatic.  Mouth/Throat: Oropharynx is clear and moist.  Eyes: Conjunctivae are normal.  Neck: Neck supple.  Cardiovascular: Normal rate and regular rhythm.   Pulmonary/Chest: Effort normal.   Abdominal: Soft. She exhibits no distension. There is no tenderness.  Musculoskeletal: Normal range of motion.  Neurological: She is alert and oriented to person, place, and time.  Skin: No rash noted.  Psychiatric: She has a normal mood and affect.  Vitals reviewed.  UC Treatments / Results  Labs (all labs ordered are listed, but only abnormal results are displayed) Labs Reviewed  RAPID INFLUENZA A&B ANTIGENS ([facility] ONLY) - Abnormal; Notable for the following:       Result Value   Influenza A ([facility]) POSITIVE (*)    All other components within normal limits   EKG  EKG Interpretation None       Radiology No results found.  Procedures Procedures (including critical care time)  Medications Ordered in UC Medications - No data to display  Initial Impression / Assessment and Plan / UC Course  I have reviewed the triage vital signs and the nursing notes.  Pertinent labs & imaging results that were available during my care of the patient were reviewed by me and considered in my medical decision making (see chart for details).   36 year old female presents with influenza. Treating with Tamiflu.  Final Clinical Impressions(s) / UC Diagnoses   Final diagnoses:  Influenza   New Prescriptions Discharge Medication List as of 01/16/2017  5:19 PM    START taking these medications   Details  oseltamivir (TAMIFLU) 75 MG capsule Take 1 capsule (75 mg total) by mouth every 12 (twelve) hours., Starting Sun 01/16/2017, Normal         Tommie Sams, DO 01/16/17 1734

## 2017-01-16 NOTE — ED Triage Notes (Signed)
Flu symptoms, Cough, congested, fever, runny nose, chills, body aches, headaches for 3 days. Son was diagnosed with FLU on Thursday

## 2017-01-16 NOTE — Discharge Instructions (Signed)
You have the flu.  Start medication immediately.  Tylenol, ibuprofen as needed   Take care  Dr. Adriana Simasook

## 2017-05-30 ENCOUNTER — Ambulatory Visit
Admission: EM | Admit: 2017-05-30 | Discharge: 2017-05-30 | Disposition: A | Discharge status: Home / Self Care | Payer: BLUE CROSS/BLUE SHIELD | Attending: Family Medicine | Admitting: Family Medicine

## 2017-05-30 ENCOUNTER — Encounter: Payer: Self-pay | Admitting: Emergency Medicine

## 2017-05-30 DIAGNOSIS — H6591 Unspecified nonsuppurative otitis media, right ear: Secondary | ICD-10-CM | POA: Diagnosis not present

## 2017-05-30 DIAGNOSIS — J029 Acute pharyngitis, unspecified: Secondary | ICD-10-CM

## 2017-05-30 DIAGNOSIS — J069 Acute upper respiratory infection, unspecified: Secondary | ICD-10-CM

## 2017-05-30 MED ORDER — CEFDINIR 300 MG PO CAPS: 300.0000 mg | ORAL_CAPSULE | Freq: Two times a day (BID) | ORAL | 0 refills | Status: AC

## 2017-05-30 NOTE — Discharge Instructions (Signed)
Take medication as prescribed. Rest. Drink plenty of fluids. Take over the counter sudafed and zyrtec as needed.   Follow up with your primary care physician this week as needed. Return to Urgent care for new or worsening concerns.

## 2017-05-30 NOTE — ED Provider Notes (Signed)
[facility] ____________________________________________  Time seen: Approximately 10:09 AM  I have reviewed the triage vital signs and the nursing notes.   HISTORY  Chief Complaint Cough and Sore Throat   HPI Tiffany Edwards is a [age] y.o. female  presenting for evaluation of bilateral ear pain for the last 1 week. Patient states that both ears have been bothering her, currently right worse than left. Denies hearing changes or trauma. Denies drainage or surrounding pain. States mild right ear pain currently. Patient reports some sore throat only with swallowing. States does not feel like previous strep throat infections. Patient reports some intermittent fevers and has been alternating Tylenol and ibuprofen in the last few days. Reports had also been taking Mucinex last week without any change. Patient reports some runny nose, nasal congestion and cough over the last 2 days but reports ear complaints are her main complaint. Reports she has had multiple ear infections and sinus infections in past. Reports last ear infection was her right ear approximately one month ago, she with oral Augmentin. Denies any other recent antibiotic use. Reports continues to eat and drink well. Reports continues to remain active. Denies other complaints.  Denies chest pain, shortness of breath, abdominal pain, dysuria, extremity pain, extremity swelling or rash. Denies recent sickness. Denies recent antibiotic use.   Patient's last menstrual period was 05/25/2017 (exact date). Denies pregnancy.    Past Medical History:  Diagnosis Date  . Anemia   . Complication of anesthesia    reported tachycardia  . GERD (gastroesophageal reflux disease)     Patient Active Problem List   Diagnosis Date Noted  . Acute blood loss anemia 10/04/2015  . Postpartum care following vaginal delivery (10/7) 10/03/2015  . Postpartum hemorrhage 10/03/2015    Past Surgical History:  Procedure Laterality Date  .  BREAST LUMPECTOMY Right      No current facility-administered medications for this encounter.   Current Outpatient Prescriptions:  .  acetaminophen (TYLENOL) 325 MG tablet, Take 650 mg by mouth every 6 (six) hours as needed for mild pain or headache., Disp: , Rfl:  .  cefdinir (OMNICEF) 300 MG capsule, Take 1 capsule (300 mg total) by mouth 2 (two) times daily., Disp: 20 capsule, Rfl: 0 .  ibuprofen (ADVIL,MOTRIN) 800 MG tablet, Take 1 tablet (800 mg total) by mouth every 8 (eight) hours., Disp: 30 tablet, Rfl: 0 .  iron polysaccharides (NIFEREX) 150 MG capsule, Take 1 capsule (150 mg total) by mouth daily., Disp: 45 capsule, Rfl: 0 .  Magnesium 200 MG TABS, Take 400 mg by mouth daily., Disp: , Rfl:  .  Prenatal Vit-Fe Fumarate-FA (PRENATAL MULTIVITAMIN) TABS tablet, Take 1 tablet by mouth daily at 12 noon., Disp: , Rfl:   Allergies Patient has no known allergies.  History reviewed. No pertinent family history.  Social History Social History  Substance Use Topics  . Smoking status: Never Smoker  . Smokeless tobacco: Never Used  . Alcohol use No    Review of Systems Constitutional: No fever/chills Eyes: No visual changes. ENT:As above.  Cardiovascular: Denies chest pain. Respiratory: Denies shortness of breath. Gastrointestinal: No abdominal pain.   Musculoskeletal: Negative for back pain. Skin: Negative for rash.  ____________________________________________   PHYSICAL EXAM:  VITAL SIGNS: ED Triage Vitals  Enc Vitals Group     BP 05/30/17 0946 119/71     Pulse Rate 05/30/17 0946 65     Resp 05/30/17 0946 16     Temp 05/30/17 0946 98.3 F (36.8 C)  Temp Source 05/30/17 0946 Oral     SpO2 05/30/17 0946 100 %     Weight 05/30/17 0944 123 lb (55.8 kg)     Height 05/30/17 0944 5\' 4"  (1.626 m)     Head Circumference --      Peak Flow --      Pain Score 05/30/17 0944 5     Pain Loc --      Pain Edu? --      Excl. in GC? --     Constitutional: Alert and  oriented. Well appearing and in no acute distress. Eyes: Conjunctivae are normal. PERRL. EOMI. Head: Atraumatic. No sinus tenderness to palpation. No swelling. No erythema.  Ears: Left: Nontender, no erythema, normal TM, no drainage. Right: Nontender, moderate erythema with effusion present, otherwise normal TM, no drainage. No surrounding tenderness, swelling or erythema bilaterally.  Nose:Mild nasal congestion.  Mouth/Throat: Mucous membranes are moist. No pharyngeal erythema. No tonsillar swelling or exudate.  Neck: No stridor.  No cervical spine tenderness to palpation. Hematological/Lymphatic/Immunilogical: No cervical lymphadenopathy. Cardiovascular: Normal rate, regular rhythm. Grossly normal heart sounds.  Good peripheral circulation. Respiratory: Normal respiratory effort.  No retractions. No wheezes, rales or rhonchi. Good air movement.  Musculoskeletal: Ambulatory with steady gait. No cervical, thoracic or lumbar tenderness to palpation. Neurologic:  Normal speech and language. No gait instability. Skin:  Skin appears warm, dry.  Psychiatric: Mood and affect are normal. Speech and behavior are normal. ___________________________________________   LABS (all labs ordered are listed, but only abnormal results are displayed)  Labs Reviewed - No data to display  PROCEDURES Procedures    INITIAL IMPRESSION / ASSESSMENT AND PLAN / ED COURSE  Pertinent labs & imaging results that were available during my care of the patient were reviewed by me and considered in my medical decision making (see chart for details).  Well-appearing patient. No acute distress. Suspect viral upper respiratory infection versus allergic rhinitis with pharyngitis. Patient declines strep swab. Patient does have right otitis with effusion present, unresolved with over-the-counter treatments. Will treat patient with oral cefdinir, over-the-counter Zyrtec and Sudafed. Encourage rest, fluids and supportive  care.Discussed indication, risks and benefits of medications with patient.  Discussed follow up with Primary care or ENT. Discussed follow up and return parameters including no resolution or any worsening concerns. Patient verbalized understanding and agreed to plan.   ____________________________________________   FINAL CLINICAL IMPRESSION(S) / ED DIAGNOSES  Final diagnoses:  Right otitis media with effusion  Acute pharyngitis, unspecified etiology  Upper respiratory tract infection, unspecified type     Discharge Medication List as of 05/30/2017 10:17 AM    START taking these medications   Details  cefdinir (OMNICEF) 300 MG capsule Take 1 capsule (300 mg total) by mouth 2 (two) times daily., Starting Mon 05/30/2017, Until Thu 06/09/2017, Normal        Note: This dictation was prepared with Dragon dictation along with smaller phrase technology. Any transcriptional errors that result from this process are unintentional.         Renford Dills, NP 05/30/17 1057

## 2017-05-30 NOTE — ED Triage Notes (Signed)
Patient c/o ear pain for the past 2 weeks.  Patient c/o sore throat and cough that started couple days ago. Patient reports fevers

## 2017-12-27 NOTE — L&D Delivery Note (Signed)
Delivery Note At  a viable and healthy female was delivered over  intact perineum, svd(Presentation:direct OP ;  ).  APGAR: , ; weight  not yet done.   Placenta status: delivered spontaneously ,intact .  Cord: 3vv  with the following complications:loose nuchal x1, easily reduced .  Anesthesia:  epidural Episiotomy:  none Lacerations:  none Suture Repair: n/a Est. Blood Loss (mL):  150  Mom to postpartum.  Baby to Couplet care / Skin to Skin.  Vick FreesSusan E Kamariah Fruchter 11/18/2018, 12:48 PM

## 2018-05-04 LAB — OB RESULTS CONSOLE RUBELLA ANTIBODY, IGM: Rubella: IMMUNE

## 2018-05-04 LAB — OB RESULTS CONSOLE HEPATITIS B SURFACE ANTIGEN: HEP B S AG: NEGATIVE

## 2018-05-04 LAB — OB RESULTS CONSOLE GC/CHLAMYDIA
CHLAMYDIA, DNA PROBE: NEGATIVE
Gonorrhea: NEGATIVE

## 2018-05-04 LAB — OB RESULTS CONSOLE HIV ANTIBODY (ROUTINE TESTING): HIV: NONREACTIVE

## 2018-05-04 LAB — OB RESULTS CONSOLE ABO/RH: RH TYPE: NEGATIVE

## 2018-05-04 LAB — OB RESULTS CONSOLE RPR: RPR: NONREACTIVE

## 2018-05-04 LAB — OB RESULTS CONSOLE ANTIBODY SCREEN: Antibody Screen: NEGATIVE

## 2018-08-22 ENCOUNTER — Other Ambulatory Visit: Payer: Self-pay | Admitting: Obstetrics & Gynecology

## 2018-08-22 DIAGNOSIS — R748 Abnormal levels of other serum enzymes: Secondary | ICD-10-CM

## 2018-08-22 DIAGNOSIS — R509 Fever, unspecified: Secondary | ICD-10-CM

## 2018-08-30 ENCOUNTER — Other Ambulatory Visit: Payer: BLUE CROSS/BLUE SHIELD

## 2018-09-07 ENCOUNTER — Other Ambulatory Visit (HOSPITAL_COMMUNITY): Payer: Self-pay | Admitting: Obstetrics

## 2018-09-07 ENCOUNTER — Other Ambulatory Visit: Payer: Self-pay

## 2018-09-07 ENCOUNTER — Encounter (HOSPITAL_COMMUNITY): Payer: Self-pay

## 2018-09-07 ENCOUNTER — Ambulatory Visit (HOSPITAL_COMMUNITY)
Admission: RE | Admit: 2018-09-07 | Discharge: 2018-09-07 | Disposition: A | Discharge status: Home / Self Care | Payer: BLUE CROSS/BLUE SHIELD | Source: Ambulatory Visit | Attending: Obstetrics | Admitting: Obstetrics

## 2018-09-07 ENCOUNTER — Ambulatory Visit (HOSPITAL_BASED_OUTPATIENT_CLINIC_OR_DEPARTMENT_OTHER)
Admission: RE | Admit: 2018-09-07 | Discharge: 2018-09-07 | Disposition: A | Discharge status: Home / Self Care | Payer: BLUE CROSS/BLUE SHIELD | Source: Ambulatory Visit | Attending: Obstetrics | Admitting: Obstetrics

## 2018-09-07 DIAGNOSIS — B259 Cytomegaloviral disease, unspecified: Secondary | ICD-10-CM | POA: Diagnosis not present

## 2018-09-07 DIAGNOSIS — O98513 Other viral diseases complicating pregnancy, third trimester: Secondary | ICD-10-CM

## 2018-09-07 DIAGNOSIS — O09522 Supervision of elderly multigravida, second trimester: Secondary | ICD-10-CM | POA: Diagnosis not present

## 2018-09-07 DIAGNOSIS — Z3A28 28 weeks gestation of pregnancy: Secondary | ICD-10-CM | POA: Insufficient documentation

## 2018-09-07 DIAGNOSIS — Z3689 Encounter for other specified antenatal screening: Secondary | ICD-10-CM

## 2018-09-07 DIAGNOSIS — Z363 Encounter for antenatal screening for malformations: Secondary | ICD-10-CM

## 2018-09-07 DIAGNOSIS — Z3A29 29 weeks gestation of pregnancy: Secondary | ICD-10-CM

## 2018-09-07 HISTORY — DX: Other specified abnormal immunological findings in serum: R76.8

## 2018-09-07 HISTORY — DX: Abnormal immunological findings in specimens from other organs, systems and tissues: R89.4

## 2018-09-07 NOTE — Consult Note (Addendum)
Maternal-Fetal Medicine  Name: Tiffany Edwards MRN: [id number] Requesting Provider: Dr. Pamala Hurry  I had the pleasure of seeing [name] [facility] at [facility]. She was accompanied by her husband. 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  Patient reports she had intermittent fever from 08/19/18 till 08/28/18. She had no rashes or cough or nasal discharge. No neck swelling (lymph nodes) or abdominal pain.  On blood work, liver enzymes were increased. 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 Hepatitis panel (A and C) and infectious mononucleosis screening is negative.  Her prenatal course has, otherwise, been uneventful. She had low risk for fetal aneuploidies and open-neural tube defects on screening. She will be undergoing screening for gestational diabetes next week. Her blood pressures have been normal.  PMH: No history of diabetes or hypertension or any chronic medical conditions. 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 Allergies: NKDA. Social: Denies tobacco or drug or alcohol use. She has been married 4 years and her husband is in good health. Family: Father had recent pulmonary embolism (he had prostate cancer). Obstetric history is significant for a term vaginal delivery in 2016 of a female infant weighing 8-7 at birth. Her pregnancy was complicated by gestational hypertension (close to delivery). In addition, she had one early spontaneous miscarriage. Gyn history: History of LEEP.  Labs (09/05): CMV IgM 160 (increased), IgG normal. AST 123, ALT 124. Hb 11, Hct 30.9, WBC 8.9, PLT 185. Bile acid 8.6 (normal).  Sample for IgG avidity test was apparently sent yesterday.  On ultrasound, amniotic fluid is normal and good fetal activity is seen. Fetal growth is appropriate for gestational age. Fetal anatomy appears normal, but limited by advanced gestational  age. Intracranial structures appear normal with no evidence of ventriculomegaly or calcifications. No liver or abdominal calcifications are seen. Head circumference measurement is within normal limits.  I counseled the couple on the following (some comments are for the provider):  CMV infection:  I explained the test results and that serum IgG avidity test should confirm primary infection.  Cytomegalovirus can cause congenital infection and, in a few cases, the newborn can have hearing loss and neurological complications. With primary maternal infection, the transmission to fetus can occur in up to 40% if the infection is acquired in the second trimester (our patient). Transmission rate is higher if acquired in the third trimester, but more serious sequelae is seen if infection is acquired in the first trimester. There is a 25% likelihood of fetal infection if primary infection occurs.  Of the fetuses with infection, there is about 15% to 30% chance of the newborns having symptoms and up to 25% can have sequelae.   Ultrasound features of CMV infection include microcephaly, ventriculomegaly, cerebral and liver calcifications and fetal growth restriction. I reassured the couple that I did not find any abnormal findings on today's ultrasound.  I discussed the option of amniocentesis to rule out fetal infection. Positive amniotic fluid for infection does not always lead to neonatal infection, and ultrasound may not reveal any findings if fetus is affected. Negative result from amniocentesis also does not rule out fetal infection, but has a greater negative predictive value. I informed the patient that she can opt for amniocentesis without waiting for IgG avidity results (as they are not 100% confirmatory). Patient informed she would wait for the results and make a decision. I explained the procedure and possible complications  including preterm premature rupture of membranes or preterm delivery (very  low).  Treatment with hyperimmune globulin (IVIG) of pregnant women to prevent or treat congenital CMV has met with some success. Recent randomized trial has not shown any difference in the neonatal infection rates.    Currently, no clear recommendations exist for treatment of fetal infection. In the absence of well-known benefits, antenatal treatment with IVIG (cytogam) is not recommended (Wells, No. 151, June 2015). I do not recommend hyperimmune globulin treatment if amniocentesis shows positive for CMV infection.  After birth, neonatal infection will be confirmed by urine culture for CMV. Breastfeeding is not contraindicated.  Recommendations: -Follow IgG avidity result. -Patient will make a decision on amniocentesis after IgG avidity result. -An appointment was made for her to return in 4 weeks for fetal growth assessment. Thank you for your consult. Please do not hesitate to contact me if you have any questions or concerns.  Consultation including face-to-face counseling: 40 min.

## 2018-09-11 ENCOUNTER — Other Ambulatory Visit (HOSPITAL_COMMUNITY): Payer: Self-pay | Admitting: *Deleted

## 2018-09-11 DIAGNOSIS — Z362 Encounter for other antenatal screening follow-up: Secondary | ICD-10-CM

## 2018-10-05 ENCOUNTER — Other Ambulatory Visit: Payer: Self-pay

## 2018-10-05 ENCOUNTER — Ambulatory Visit (HOSPITAL_COMMUNITY)
Admission: RE | Admit: 2018-10-05 | Discharge: 2018-10-05 | Disposition: A | Discharge status: Home / Self Care | Payer: BLUE CROSS/BLUE SHIELD | Source: Ambulatory Visit | Attending: Obstetrics | Admitting: Obstetrics

## 2018-10-05 ENCOUNTER — Encounter (HOSPITAL_COMMUNITY): Payer: Self-pay

## 2018-10-05 DIAGNOSIS — Z3A32 32 weeks gestation of pregnancy: Secondary | ICD-10-CM | POA: Insufficient documentation

## 2018-10-05 DIAGNOSIS — Z363 Encounter for antenatal screening for malformations: Secondary | ICD-10-CM

## 2018-10-05 DIAGNOSIS — O98513 Other viral diseases complicating pregnancy, third trimester: Secondary | ICD-10-CM | POA: Insufficient documentation

## 2018-10-05 DIAGNOSIS — B259 Cytomegaloviral disease, unspecified: Secondary | ICD-10-CM | POA: Insufficient documentation

## 2018-10-05 DIAGNOSIS — O09523 Supervision of elderly multigravida, third trimester: Secondary | ICD-10-CM | POA: Diagnosis not present

## 2018-10-05 DIAGNOSIS — Z362 Encounter for other antenatal screening follow-up: Secondary | ICD-10-CM | POA: Diagnosis present

## 2018-10-27 LAB — OB RESULTS CONSOLE GBS: STREP GROUP B AG: NEGATIVE

## 2018-11-14 ENCOUNTER — Encounter (HOSPITAL_COMMUNITY): Payer: Self-pay | Admitting: *Deleted

## 2018-11-14 ENCOUNTER — Telehealth (HOSPITAL_COMMUNITY): Payer: Self-pay | Admitting: *Deleted

## 2018-11-14 NOTE — Telephone Encounter (Signed)
Preadmission screen  

## 2018-11-15 ENCOUNTER — Telehealth (HOSPITAL_COMMUNITY): Payer: Self-pay | Admitting: *Deleted

## 2018-11-15 ENCOUNTER — Other Ambulatory Visit: Payer: Self-pay | Admitting: Obstetrics

## 2018-11-15 ENCOUNTER — Encounter (HOSPITAL_COMMUNITY): Payer: Self-pay | Admitting: *Deleted

## 2018-11-15 NOTE — Telephone Encounter (Signed)
Preadmission screen  

## 2018-11-17 ENCOUNTER — Encounter (HOSPITAL_COMMUNITY): Payer: Self-pay

## 2018-11-17 ENCOUNTER — Inpatient Hospital Stay (HOSPITAL_COMMUNITY)
Admission: AD | Admit: 2018-11-17 | Discharge: 2018-11-19 | DRG: 806 | Disposition: A | Discharge status: Home / Self Care | Payer: BLUE CROSS/BLUE SHIELD | Attending: Obstetrics and Gynecology | Admitting: Obstetrics and Gynecology

## 2018-11-17 DIAGNOSIS — O9852 Other viral diseases complicating childbirth: Secondary | ICD-10-CM | POA: Diagnosis present

## 2018-11-17 DIAGNOSIS — B259 Cytomegaloviral disease, unspecified: Secondary | ICD-10-CM | POA: Diagnosis present

## 2018-11-17 DIAGNOSIS — O134 Gestational [pregnancy-induced] hypertension without significant proteinuria, complicating childbirth: Principal | ICD-10-CM | POA: Diagnosis present

## 2018-11-17 DIAGNOSIS — Z3A38 38 weeks gestation of pregnancy: Secondary | ICD-10-CM

## 2018-11-17 DIAGNOSIS — D649 Anemia, unspecified: Secondary | ICD-10-CM | POA: Diagnosis present

## 2018-11-17 DIAGNOSIS — O9902 Anemia complicating childbirth: Secondary | ICD-10-CM | POA: Diagnosis present

## 2018-11-17 DIAGNOSIS — Z3490 Encounter for supervision of normal pregnancy, unspecified, unspecified trimester: Secondary | ICD-10-CM

## 2018-11-17 LAB — CBC
HEMATOCRIT: 33.6 % — AB (ref 36.0–46.0)
Hemoglobin: 11.1 g/dL — ABNORMAL LOW (ref 12.0–15.0)
MCH: 31.3 pg (ref 26.0–34.0)
MCHC: 33 g/dL (ref 30.0–36.0)
MCV: 94.6 fL (ref 80.0–100.0)
Platelets: 247 10*3/uL (ref 150–400)
RBC: 3.55 MIL/uL — ABNORMAL LOW (ref 3.87–5.11)
RDW: 13.3 % (ref 11.5–15.5)
WBC: 12.1 10*3/uL — AB (ref 4.0–10.5)
nRBC: 0 % (ref 0.0–0.2)

## 2018-11-17 LAB — COMPREHENSIVE METABOLIC PANEL
ALBUMIN: 3 g/dL — AB (ref 3.5–5.0)
ALT: 13 U/L (ref 0–44)
ANION GAP: 11 (ref 5–15)
AST: 26 U/L (ref 15–41)
Alkaline Phosphatase: 179 U/L — ABNORMAL HIGH (ref 38–126)
BUN: 10 mg/dL (ref 6–20)
CHLORIDE: 103 mmol/L (ref 98–111)
CO2: 18 mmol/L — AB (ref 22–32)
Calcium: 8.4 mg/dL — ABNORMAL LOW (ref 8.9–10.3)
Creatinine, Ser: 0.77 mg/dL (ref 0.44–1.00)
GFR calc Af Amer: >60 mL/min (ref >60)
GFR calc non Af Amer: >60 mL/min (ref >60)
GLUCOSE: 181 mg/dL — AB (ref 70–99)
POTASSIUM: 3.7 mmol/L (ref 3.5–5.1)
SODIUM: 132 mmol/L — AB (ref 135–145)
Total Bilirubin: 0.7 mg/dL (ref 0.3–1.2)
Total Protein: 6.6 g/dL (ref 6.5–8.1)

## 2018-11-17 LAB — PROTEIN / CREATININE RATIO, URINE: CREATININE, URINE: 46 mg/dL

## 2018-11-17 MED ORDER — LIDOCAINE HCL (PF) 1 % IJ SOLN
30.0000 mL | INTRAMUSCULAR | Status: DC | PRN
  Filled 2018-11-17: qty 30

## 2018-11-17 MED ORDER — OXYTOCIN 40 UNITS IN LACTATED RINGERS INFUSION - SIMPLE MED: 1.0000 m[IU]/min | INTRAVENOUS | Status: DC

## 2018-11-17 MED ORDER — ONDANSETRON HCL 4 MG/2ML IJ SOLN: 4.0000 mg | Freq: Four times a day (QID) | INTRAMUSCULAR | Status: DC | PRN

## 2018-11-17 MED ORDER — OXYTOCIN BOLUS FROM INFUSION
500.0000 mL | Freq: Once | INTRAVENOUS | Status: AC
  Administered 2018-11-18: 500 mL via INTRAVENOUS

## 2018-11-17 MED ORDER — OXYTOCIN 40 UNITS IN LACTATED RINGERS INFUSION - SIMPLE MED
1.0000 m[IU]/min | INTRAVENOUS | Status: DC
  Administered 2018-11-17: 2 m[IU]/min via INTRAVENOUS
  Filled 2018-11-17: qty 1000

## 2018-11-17 MED ORDER — SOD CITRATE-CITRIC ACID 500-334 MG/5ML PO SOLN
30.0000 mL | ORAL | Status: DC | PRN
  Administered 2018-11-18 (×2): 30 mL via ORAL
  Filled 2018-11-17 (×2): qty 15

## 2018-11-17 MED ORDER — OXYTOCIN 40 UNITS IN LACTATED RINGERS INFUSION - SIMPLE MED: 2.5000 [IU]/h | INTRAVENOUS | Status: DC

## 2018-11-17 MED ORDER — LACTATED RINGERS IV SOLN
INTRAVENOUS | Status: DC
  Administered 2018-11-17 – 2018-11-18 (×4): via INTRAVENOUS

## 2018-11-17 MED ORDER — LACTATED RINGERS IV SOLN
500.0000 mL | INTRAVENOUS | Status: DC | PRN
  Administered 2018-11-18: 500 mL via INTRAVENOUS

## 2018-11-17 MED ORDER — ACETAMINOPHEN 325 MG PO TABS: 650.0000 mg | ORAL_TABLET | ORAL | Status: DC | PRN

## 2018-11-17 MED ORDER — TERBUTALINE SULFATE 1 MG/ML IJ SOLN
0.2500 mg | Freq: Once | INTRAMUSCULAR | Status: DC | PRN
  Filled 2018-11-17: qty 1

## 2018-11-17 NOTE — H&P (Signed)
Tiffany Edwards is a 37 y.o. G3P1011 at [redacted]w[redacted]d presenting for IOL due to gest htn. Pt with h/o IOL for gest htn with onset of severe PP PEC last pregnancy. Pt has been monitored weekly over the last month due to active CMV in pregnancy and has been feeling well until 2 days ago. Pt notes 'just not right' and 'I feel like I did when I had PEC.' Pt reports HA for the past 2 days, with no response to Tylenol. She has not checked home bp. Reports some 'blurriness' to vision but no scotomata. Pt notes intermittent contractions. Good fetal movement, No vaginal bleeding, not leaking fluid.  [facility] at [facility] since 7 wks - Dated by LMP c/w 7 wk u/s - AMA, nl NT, nl AFP - h/o LEEP, nl 16 wk CL - CMV, active infection around 20 wks of pregnancy with maternal fevers, RUQ pain, elevated LFTs, malaise. Pt with seroconversions of CMV IgG and IgM. Pt had MFM consult, declined amnio and since followed with monthly growth u/s and 3rd trimester testing (BPP/ NST). No u/s abnormalities c/w fetal infection. Plan for fetal salivary testing for CMV at birth and peds f.u- consider CMV infant clinic at [facility].  - fetal growth: b/l CPCP, EIF present. 34 wks 66%, 38 wks 7'0, 63%   Prenatal Transfer Tool  Maternal Diabetes: No Genetic Screening: Normal Maternal Ultrasounds/Referrals: Normal Fetal Ultrasounds or other Referrals:  Referred to Materal Fetal Medicine  Maternal Substance Abuse:  No Significant Maternal Medications:  None Significant Maternal Lab Results: None     OB History    Gravida  3   Para  1   Term  1   Preterm      AB  1   Living  1     SAB  1   TAB      Ectopic      Multiple  0   Live Births  1          Past Medical History:  Diagnosis Date  . Anemia   . CMV (cytomegalovirus) antibody positive   . Complication of anesthesia    reported tachycardia  . GERD (gastroesophageal reflux disease)   . History of gestational hypertension   . Vaginal Pap smear, abnormal     Past Surgical History:  Procedure Laterality Date  . BREAST LUMPECTOMY Right   . EXTERNAL EAR SURGERY    . LEEP     Family History: family history includes Birth defects in her cousin; COPD in her maternal grandmother; Diabetes in her maternal grandfather and paternal grandfather; Heart attack in her paternal grandfather; Heart disease in her maternal grandmother; Hypertension in her father. Social History:  reports that she has never smoked. She has never used smokeless tobacco. She reports that she does not drink alcohol or use drugs.  Review of Systems - Negative except Headache, contractions   Dilation: 2.5 Effacement (%): 20 Station: -1 Exam by:: Mary [place] Johnson, RN  Blood pressure 135/61, pulse 93, temperature 97.7 F (36.5 C), temperature source Oral, resp. rate 16, height 5' 3.5" (1.613 m), weight 68.6 kg, last menstrual period 02/22/2018.  Physical Exam: bp in office 142/88 Gen:  no distress CV: RRR Pulm: CTAB Back: no CVAT Abd: gravid, NT, no RUQ pain LE: trace edema, equal bilaterally, non-tender   Prenatal labs: ABO, Rh: --/--/B NEG (11/22 1612) Antibody: POS (11/22 1612) Rubella: Immune (05/09 0000) RPR: Nonreactive (05/09 0000)  HBsAg: Negative (05/09 0000)  HIV: Non-reactive (05/09 0000)  GBS: Negative (11/01 0000)  1 hr Glucola 103  Genetic screening nl NT, nl AFP Anatomy US normal  CBC    Component Value Date/Time   WBC 12.1 (H) 11/17/2018 1612   RBC 3.55 (L) 11/17/2018 1612   HGB 11.1 (L) 11/17/2018 1612   HCT 33.6 (L) 11/17/2018 1612   PLT 247 11/17/2018 1612   MCV 94.6 11/17/2018 1612   MCH 31.3 11/17/2018 1612   MCHC 33.0 11/17/2018 1612   RDW 13.3 11/17/2018 1612    CMP     Component Value Date/Time   NA 132 (L) 11/17/2018 1612   K 3.7 11/17/2018 1612   CL 103 11/17/2018 1612   CO2 18 (L) 11/17/2018 1612   GLUCOSE 181 (H) 11/17/2018 1612   BUN 10 11/17/2018 1612   CREATININE 0.77 11/17/2018 1612   CALCIUM 8.4 (L) 11/17/2018  1612   PROT 6.6 11/17/2018 1612   ALBUMIN 3.0 (L) 11/17/2018 1612   AST 26 11/17/2018 1612   ALT 13 11/17/2018 1612   ALKPHOS 179 (H) 11/17/2018 1612   BILITOT 0.7 11/17/2018 1612   GFRNONAA >60 11/17/2018 1612   GFRAA >60 11/17/2018 1612      Assessment/Plan: 37 y.o. G3P1011 at 2753w2d Pt with HA x 2 days, elevated bp in office, 'feeling not right" and h/o gest htn and PP severe PEC. Given sx, bp, gestational age and cervical dilation, recc admission for gestational hypertension and delivery. NO evidence of PEC on exam or admitting labs but given her bp and history she remains at risk of PEC. Benefits of delivery outweight fetal risks of prematurity. Since admission bps have been improved. Will still plan IOL with pitocin 2x2. 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 Salivary gland testing of baby at birth, close peds f/u, [facility]. Lendon ColonelKelly A Koula Venier 11/17/2018 7:15 PM    Lendon ColonelKelly A Thaddeaus Monica 11/17/2018, 7:03 PM

## 2018-11-18 ENCOUNTER — Inpatient Hospital Stay (HOSPITAL_COMMUNITY): Payer: BLUE CROSS/BLUE SHIELD | Admitting: Anesthesiology

## 2018-11-18 ENCOUNTER — Encounter (HOSPITAL_COMMUNITY): Payer: Self-pay | Admitting: *Deleted

## 2018-11-18 LAB — CBC
HCT: 32.8 % — ABNORMAL LOW (ref 36.0–46.0)
HEMOGLOBIN: 11 g/dL — AB (ref 12.0–15.0)
MCH: 31.3 pg (ref 26.0–34.0)
MCHC: 33.5 g/dL (ref 30.0–36.0)
MCV: 93.2 fL (ref 80.0–100.0)
NRBC: 0 % (ref 0.0–0.2)
Platelets: 232 10*3/uL (ref 150–400)
RBC: 3.52 MIL/uL — AB (ref 3.87–5.11)
RDW: 13.3 % (ref 11.5–15.5)
WBC: 13.6 10*3/uL — ABNORMAL HIGH (ref 4.0–10.5)

## 2018-11-18 LAB — RPR: RPR Ser Ql: NONREACTIVE

## 2018-11-18 MED ORDER — DIBUCAINE 1 % RE OINT: 1.0000 "application " | TOPICAL_OINTMENT | RECTAL | Status: DC | PRN

## 2018-11-18 MED ORDER — PHENYLEPHRINE 40 MCG/ML (10ML) SYRINGE FOR IV PUSH (FOR BLOOD PRESSURE SUPPORT)
80.0000 ug | PREFILLED_SYRINGE | INTRAVENOUS | Status: DC | PRN
  Filled 2018-11-18: qty 5
  Filled 2018-11-18: qty 10

## 2018-11-18 MED ORDER — LACTATED RINGERS IV SOLN: 500.0000 mL | Freq: Once | INTRAVENOUS | Status: DC

## 2018-11-18 MED ORDER — EPHEDRINE 5 MG/ML INJ
10.0000 mg | INTRAVENOUS | Status: DC | PRN
  Filled 2018-11-18: qty 2

## 2018-11-18 MED ORDER — VITAMIN K1 1 MG/0.5ML IJ SOLN
INTRAMUSCULAR | Status: AC
  Filled 2018-11-18: qty 0.5

## 2018-11-18 MED ORDER — ONDANSETRON HCL 4 MG PO TABS: 4.0000 mg | ORAL_TABLET | ORAL | Status: DC | PRN

## 2018-11-18 MED ORDER — ONDANSETRON HCL 4 MG/2ML IJ SOLN: 4.0000 mg | INTRAMUSCULAR | Status: DC | PRN

## 2018-11-18 MED ORDER — DIPHENHYDRAMINE HCL 50 MG/ML IJ SOLN: 12.5000 mg | INTRAMUSCULAR | Status: DC | PRN

## 2018-11-18 MED ORDER — WITCH HAZEL-GLYCERIN EX PADS: 1.0000 "application " | MEDICATED_PAD | CUTANEOUS | Status: DC | PRN

## 2018-11-18 MED ORDER — LIDOCAINE HCL (PF) 1 % IJ SOLN
INTRAMUSCULAR | Status: DC | PRN
  Administered 2018-11-18: 5 mL via EPIDURAL

## 2018-11-18 MED ORDER — ACETAMINOPHEN 325 MG PO TABS
650.0000 mg | ORAL_TABLET | ORAL | Status: DC | PRN
  Administered 2018-11-19: 650 mg via ORAL
  Filled 2018-11-18: qty 2

## 2018-11-18 MED ORDER — PRENATAL MULTIVITAMIN CH
1.0000 | ORAL_TABLET | Freq: Every day | ORAL | Status: DC
  Administered 2018-11-19: 1 via ORAL
  Filled 2018-11-18: qty 1

## 2018-11-18 MED ORDER — PHENYLEPHRINE 40 MCG/ML (10ML) SYRINGE FOR IV PUSH (FOR BLOOD PRESSURE SUPPORT)
80.0000 ug | PREFILLED_SYRINGE | INTRAVENOUS | Status: DC | PRN
  Filled 2018-11-18: qty 5

## 2018-11-18 MED ORDER — TETANUS-DIPHTH-ACELL PERTUSSIS 5-2.5-18.5 LF-MCG/0.5 IM SUSP: 0.5000 mL | Freq: Once | INTRAMUSCULAR | Status: DC

## 2018-11-18 MED ORDER — SIMETHICONE 80 MG PO CHEW: 80.0000 mg | CHEWABLE_TABLET | ORAL | Status: DC | PRN

## 2018-11-18 MED ORDER — IBUPROFEN 600 MG PO TABS
600.0000 mg | ORAL_TABLET | Freq: Four times a day (QID) | ORAL | Status: DC
  Administered 2018-11-18 – 2018-11-19 (×5): 600 mg via ORAL
  Filled 2018-11-18 (×5): qty 1

## 2018-11-18 MED ORDER — DIPHENHYDRAMINE HCL 25 MG PO CAPS: 25.0000 mg | ORAL_CAPSULE | Freq: Four times a day (QID) | ORAL | Status: DC | PRN

## 2018-11-18 MED ORDER — BENZOCAINE-MENTHOL 20-0.5 % EX AERO: 1.0000 "application " | INHALATION_SPRAY | CUTANEOUS | Status: DC | PRN

## 2018-11-18 MED ORDER — SENNOSIDES-DOCUSATE SODIUM 8.6-50 MG PO TABS
2.0000 | ORAL_TABLET | ORAL | Status: DC
  Administered 2018-11-18: 2 via ORAL
  Filled 2018-11-18: qty 2

## 2018-11-18 MED ORDER — FENTANYL 2.5 MCG/ML BUPIVACAINE 1/10 % EPIDURAL INFUSION (WH - ANES)
14.0000 mL/h | INTRAMUSCULAR | Status: DC | PRN
  Administered 2018-11-18: 14 mL/h via EPIDURAL
  Filled 2018-11-18: qty 100

## 2018-11-18 MED ORDER — COCONUT OIL OIL: 1.0000 "application " | TOPICAL_OIL | Status: DC | PRN

## 2018-11-18 NOTE — Lactation Note (Signed)
This note was copied from a baby's chart. Lactation Consultation Note  Patient Name: Tiffany Edwards YNWGN'FToday's Date: 11/18/2018 Reason for consult: Initial assessment;Early term 37-38.6wks;Other (Comment)(AMA)  6 hours old FT female who is being exclusively BF by his mother, she's a P2. Mom is experienced BF, she was able to BF her last child for 8-9 months; and the only BF difficulty that she faced was a difficult latch in the beginning, she had to use NS for her first one. She didn't know about this baby, so she brought her NS to the hospital. Per mom BF is going well and baby has been able to get to the breast without any BF tools so far. Mom has 2 DEBP at home, one of them is Medela, she can't remember the name of the other one.  Baby just started feeding when entering the room, noticed that mom had baby wrapped in a blanket and he was also wearing clothes. Mom voiced that is due to baby's temperature, but once it's stable she'll do STS. Some audible swallows were heard upon breast compressions, mom has been leaking colostrum since 36 weeks of pregnancy and she was also leaking in L & D.  Feeding plan:  1. Encouraged mom to feed baby 8-12 times/24 hours or sooner if feeding cues are present 2. Hand expression and spoon feeding was also encouraged  BF brochure, BF resources and feeding diary were also reviewed. Parents reported all questions and concerns were answered, they're both aware of LC services and will call PRN.  Maternal Data Formula Feeding for Exclusion: No Has patient been taught Hand Expression?: Yes Does the patient have breastfeeding experience prior to this delivery?: Yes  Feeding Feeding Type: Breast Fed  LATCH Score Latch: Grasps breast easily, tongue down, lips flanged, rhythmical sucking.  Audible Swallowing: A few with stimulation(upon breast compressions)  Type of Nipple: Everted at rest and after stimulation  Comfort (Breast/Nipple): Soft /  non-tender  Hold (Positioning): No assistance needed to correctly position infant at breast.  LATCH Score: 9  Interventions Interventions: Breast feeding basics reviewed;Assisted with latch;Breast massage;Hand express;Breast compression;Support pillows(not able to do STS at this time due to baby's temperature dropping)  Lactation Tools Discussed/Used WIC Program: No   Consult Status Consult Status: Follow-up Date: 11/19/18 Follow-up type: In-patient    Dewight Catino Venetia ConstableS Jearld Hemp 11/18/2018, 7:04 PM

## 2018-11-18 NOTE — Progress Notes (Signed)
Uncomfortable with contractions, not too painful Considering epidural soon  Patient Vitals for the past 24 hrs:  BP Temp Temp src Pulse Resp SpO2 Height Weight  11/18/18 0500 117/69 97.8 F (36.6 C) Oral 77 16 - - -  11/18/18 0430 124/71 - - 75 16 - - -  11/18/18 0330 122/64 - - 74 16 - - -  11/18/18 0300 118/67 - - 74 16 - - -  11/18/18 0230 123/69 - - 80 16 - - -  11/18/18 0131 121/71 97.9 F (36.6 C) Oral 78 16 - - -  11/18/18 0032 122/72 - - 78 - - - -  11/18/18 0015 - - - - - 96 % - -  11/18/18 0010 - - - - - 95 % - -  11/18/18 0001 128/80 - - 80 - - - -  11/17/18 2331 125/74 - - 90 - - - -  11/17/18 2301 126/71 - - 89 - - - -  11/17/18 2231 126/72 - - 92 16 - - -  11/17/18 2201 124/76 - - 88 - - - -  11/17/18 2131 137/77 - - 90 - - - -  11/17/18 2119 - (!) 97 F (36.1 C) Axillary - 16 - - -  11/17/18 2101 (!) 144/67 - - 89 - - - -  11/17/18 2031 124/69 - - 85 - - - -  11/17/18 2001 125/86 - - 92 - - - -  11/17/18 1931 120/71 - - 88 - - - -  11/17/18 1901 129/71 - - 94 - - - -  11/17/18 1831 135/61 - - 93 16 - - -  11/17/18 1801 121/73 - - 95 - - - -  11/17/18 1731 116/70 - - 97 - - - -  11/17/18 1724 (!) 124/59 - - 97 - - - -  11/17/18 1650 132/79 - - (!) 103 16 - - -  11/17/18 1619 - - - - - - 5' 3.5" (1.613 m) 68.6 kg  11/17/18 1553 125/80 97.7 F (36.5 C) Oral (!) 125 16 - - -   A&ox3 nml respirations Abd: soft, nt, gravid Cx: 5/50/-2, posterior but very soft and stretchy; arom with clear fluid, mod amount; iupc palced Le: no edema, nt bilat  FHT: 120s, nml variabilitiy, +accels, no decels TOCO: irreg q 1-5, at times difficult to pick up and appear even farther apart  A/P: iup at 38.3 1. GHTN, new onset - bps stable 2. iol - contin pitocin; appears not consistently adequate; iupc placed and will monitor to achieve and maintain adequate contractions; plan svd 3. Fetal status reassuring 4. CMV this pregnancy, nml growth; plan f/u pp with peds; efw 7lbs 5.  gbs neg

## 2018-11-18 NOTE — Anesthesia Pain Management Evaluation Note (Signed)
  CRNA Pain Management Visit Note  Patient: Tiffany Edwards, 37 y.o., female  "Hello I am a [facility] of the anesthesia [facility]. We have an anesthesia team available at all times to provide care throughout the hospital, including epidural management and anesthesia for C-section. I don't know your plan for the delivery whether it a natural birth, water birth, IV sedation, nitrous supplementation, doula or epidural, but we want to meet your pain goals."   1.Was your pain managed to your expectations on prior hospitalizations?   Yes   2.What is your expectation for pain management during this hospitalization?     Epidural  3.How can we help you reach that goal? epidural  Record the patient's initial score and the patient's pain goal.   Pain: 9/10  Pain Goal: 1/10 [facility] wants you to be able to say your pain was always managed very well.  Salome ArntSterling, Emnet Monk Marie 11/18/2018

## 2018-11-18 NOTE — Anesthesia Procedure Notes (Signed)
Epidural Patient location during procedure: OB Start time: 11/18/2018 7:02 AM End time: 11/18/2018 7:21 AM  Staffing Anesthesiologist: Trevor IhaHouser, Kerisha Goughnour A, MD Performed: anesthesiologist   Preanesthetic Checklist Completed: patient identified, site marked, surgical consent, pre-op evaluation, timeout performed, IV checked, risks and benefits discussed and monitors and equipment checked  Epidural Patient position: sitting Prep: site prepped and draped and DuraPrep Patient monitoring: continuous pulse ox and blood pressure Approach: midline Location: L3-L4 Injection technique: LOR air  Needle:  Needle type: Tuohy  Needle gauge: 17 G Needle length: 9 cm and 9 Needle insertion depth: 5 cm cm Catheter type: closed end flexible Catheter size: 19 Gauge Catheter at skin depth: 10 cm Test dose: negative  Assessment Events: blood not aspirated, injection not painful, no injection resistance, negative IV test and no paresthesia  Additional Notes 1 attempt . Pt tolerated procedure well

## 2018-11-18 NOTE — Progress Notes (Signed)
Comfortable with contractions but feels constant pressure in pelvis  Temp:  [97 F (36.1 C)-97.9 F (36.6 C)] 97.5 F (36.4 C) (11/23 0900) Pulse Rate:  [50-125] 65 (11/23 1000) Resp:  [16-20] 16 (11/23 1000) BP: (89-144)/(51-86) 108/58 (11/23 1000) SpO2:  [95 %-99 %] 98 % (11/23 0800) Weight:  [68.6 kg] 68.6 kg (11/22 1619)  A&ox3 nml respiration Abd: soft, nt, gravid Cx: c/c/-1, cephalic, OP LE: no edema, nt bilat  FHT: baseline 130, normal variability, +accels; variables noted with lowest to about 100, series of 3 early and late decels <401min then a 1-2 min decel to 90s then return to baseline Toco:q 2 min  A/p: IUP at 38.3 wga 1.  Complete for about 1 hr, position change and peanut; hold on pushing; did push with one contraction with patient and some effort but not excellent 2. Cat 2 tracing; allow rest while allowing rotation and laboring down 3. gbs neg

## 2018-11-18 NOTE — Anesthesia Postprocedure Evaluation (Signed)
Anesthesia Post Note  Patient: [name]  Procedure(s) Performed: AN AD HOC LABOR EPIDURAL     Patient location during evaluation: Mother Baby Anesthesia Type: Epidural Level of consciousness: awake and alert, oriented and patient cooperative Pain management: pain level controlled Vital Signs Assessment: post-procedure vital signs reviewed and stable Respiratory status: spontaneous breathing Cardiovascular status: stable Postop Assessment: no headache, epidural receding, patient able to bend at knees and no signs of nausea or vomiting Anesthetic complications: no Comments: Pain score 0.    Last Vitals:  Vitals:   11/18/18 1436 11/18/18 1557  BP: 123/71 117/64  Pulse: 67 80  Resp: 18 18  Temp: 36.7 C 36.6 C  SpO2:      Last Pain:  Vitals:   11/18/18 1710  TempSrc:   PainSc: 0-No pain   Pain Goal: Patients Stated Pain Goal: 2 (11/18/18 0745)               Merrilyn PumaWRINKLE,Marcell Pfeifer

## 2018-11-18 NOTE — Anesthesia Preprocedure Evaluation (Signed)
Anesthesia Evaluation  Patient identified by MRN, date of birth, ID band Patient awake    Reviewed: Allergy & Precautions, NPO status , Patient's Chart, lab work & pertinent test results  History of Anesthesia Complications Negative for: history of anesthetic complications  Airway Mallampati: II  TM Distance: >3 FB Neck ROM: Full    Dental   Pulmonary neg pulmonary ROS,    breath sounds clear to auscultation       Cardiovascular hypertension (Gestational),  Rhythm:Regular Rate:Normal     Neuro/Psych negative neurological ROS  negative psych ROS   GI/Hepatic Neg liver ROS, GERD  ,  Endo/Other  negative endocrine ROS  Renal/GU negative Renal ROS     Musculoskeletal negative musculoskeletal ROS (+)   Abdominal   Peds  Hematology  (+) anemia ,   Anesthesia Other Findings   Reproductive/Obstetrics (+) Pregnancy                             Anesthesia Physical Anesthesia Plan  ASA: II  Anesthesia Plan: Epidural   Post-op Pain Management:    Induction:   PONV Risk Score and Plan: 2 and Treatment may vary due to age or medical condition  Airway Management Planned: Natural Airway  Additional Equipment: None  Intra-op Plan:   Post-operative Plan:   Informed Consent: I have reviewed the patients History and Physical, chart, labs and discussed the procedure including the risks, benefits and alternatives for the proposed anesthesia with the patient or authorized representative who has indicated his/her understanding and acceptance.     Plan Discussed with: Anesthesiologist  Anesthesia Plan Comments: (Labs reviewed. Platelets acceptable, patient not taking any blood thinning medications. Per RN, FHR tracing reported to be stable enough for sitting procedure. Risks and benefits discussed with patient, including PDPH, backache, epidural hematoma, failed epidural, allergic reaction, and  nerve injury. Patient expressed understanding and wished to proceed.)        Anesthesia Quick Evaluation

## 2018-11-19 ENCOUNTER — Other Ambulatory Visit: Payer: Self-pay

## 2018-11-19 LAB — CBC
HEMATOCRIT: 31.8 % — AB (ref 36.0–46.0)
Hemoglobin: 10.5 g/dL — ABNORMAL LOW (ref 12.0–15.0)
MCH: 30.9 pg (ref 26.0–34.0)
MCHC: 33 g/dL (ref 30.0–36.0)
MCV: 93.5 fL (ref 80.0–100.0)
PLATELETS: 226 10*3/uL (ref 150–400)
RBC: 3.4 MIL/uL — ABNORMAL LOW (ref 3.87–5.11)
RDW: 13.3 % (ref 11.5–15.5)
WBC: 15.5 10*3/uL — AB (ref 4.0–10.5)
nRBC: 0 % (ref 0.0–0.2)

## 2018-11-19 MED ORDER — RHO D IMMUNE GLOBULIN 1500 UNIT/2ML IJ SOSY
300.0000 ug | PREFILLED_SYRINGE | Freq: Once | INTRAMUSCULAR | Status: AC
  Administered 2018-11-19: 300 ug via INTRAVENOUS
  Filled 2018-11-19: qty 2

## 2018-11-19 MED ORDER — IBUPROFEN 600 MG PO TABS: 600.0000 mg | ORAL_TABLET | Freq: Four times a day (QID) | ORAL | 0 refills | Status: AC

## 2018-11-19 NOTE — Progress Notes (Signed)
PPD 1 SVD  S:  Reports feeling ok - wants early DC             Tolerating po/ No nausea or vomiting             Bleeding is moderate             Pain controlled with motrin             Up ad lib / ambulatory / voiding QS  Newborn Breast  O:   VS: BP 120/62 (BP Location: Left Arm)   Pulse 64   Temp 98.2 F (36.8 C) (Oral)   Resp 18   Ht 5' 3.5" (1.613 m)   Wt 68.6 kg   LMP 02/22/2018 (Approximate)   SpO2 98%   Breastfeeding? Unknown   BMI 26.38 kg/m    LABS:            Recent Labs    11/18/18 0624 11/19/18 0605  WBC 13.6* 15.5*  HGB 11.0* 10.5*  PLT 232 226               Blood type: --/--/B NEG (11/24 16100605) / newborn Rh positive - rhogam ordered  Rubella: Immune (05/09 0000)                     I&O: Intake/Output      11/23 0701 - 11/24 0700 11/24 0701 - 11/25 0700   Urine (mL/kg/hr) 300 (0.2)    Blood 150    Total Output 450    Net -450         Urine Occurrence 1 x                  Physical Exam:             Alert and oriented X3  Lungs: Clear and unlabored  Heart: regular rate and rhythm / no mumurs  Abdomen: soft, non-tender, non-distended              Fundus: firm, non-tender, Ueven  Perineum: ice pack in place  Lochia: light-moderate  Extremities: no edema, no calf pain or tenderness    A: PPD # 1   Doing well - stable status  P: Routine post partum orders  DC home  Marlinda Mikeanya Chibueze Beasley CNM, MSN, [facility] 11/19/2018, 1740

## 2018-11-19 NOTE — Lactation Note (Signed)
This note was copied from a baby's chart. Lactation Consultation Note  Patient Name: Tiffany Edwards WUJWJ'XToday's Date: 11/19/2018 Reason for consult: Follow-up assessment;Early term 6437-38.6wks  P2 mother whose infant is now 6321 hours old.  Mother breast fed her first child for 14 months.  Baby was sleeping in bassinet and parents were trying to rest when I arrived.  Mother had no questions/concerns related to breast feeding and stated, "Everything is going well."  She did not need my assistance.  Per mom, breasts are soft and non tender and nipples are intact.  She will call as needed.  Mother has a DEBP for home use.  Offered to place a "Quiet" sign on her door but she declined at this time.    Maternal Data Has patient been taught Hand Expression?: Yes Does the patient have breastfeeding experience prior to this delivery?: Yes  Feeding    LATCH Score                   Interventions    Lactation Tools Discussed/Used WIC Program: No   Consult Status Consult Status: Follow-up Date: 11/20/18 Follow-up type: In-patient    Jaivyn Gulla R Kenika Sahm 11/19/2018, 10:25 AM

## 2018-11-19 NOTE — Discharge Summary (Signed)
Obstetric Discharge Summary Reason for Admission: induction of labor - gestational hypertension Prenatal Procedures: serial labs Intrapartum Procedures: spontaneous vaginal delivery Postpartum Procedures: Rho(D) Ig Complications-Operative and Postpartum: none Hemoglobin  Date Value Ref Range Status  11/19/2018 10.5 (L) 12.0 - 15.0 g/dL Final   HCT  Date Value Ref Range Status  11/19/2018 31.8 (L) 36.0 - 46.0 % Final    Physical Exam:  General: alert, cooperative and no distress Lochia: appropriate Uterine Fundus: firm Perineum: intact DVT Evaluation: No evidence of DVT seen on physical exam.  Discharge Diagnoses: Term Pregnancy-delivered  Discharge Information: Date: 11/19/2018 Activity: pelvic rest Diet: routine Medications: PNV and Ibuprofen Condition: stable Instructions: refer to practice specific booklet Discharge to: home Follow-up Information    Vick FreesAlmquist, Susan E, MD. Schedule an appointment as soon as possible for a visit in 6 week(s).   Specialty:  Obstetrics and Gynecology Contact information: [address] [place] [address] [phone]           Newborn Data: Live born female  Birth Weight: 7 lb 2.1 oz (3235 g) APGAR: 9, 9  Newborn Delivery   Birth date/time:  11/18/2018 12:34:00 Delivery type:  Vaginal, Spontaneous     Home with mother.  Tiffany Edwards 11/19/2018, 5:45 PM

## 2018-11-20 LAB — RH IG WORKUP (INCLUDES ABO/RH)
ABO/RH(D): B NEG
FETAL SCREEN: NEGATIVE
Gestational Age(Wks): 38.3
UNIT DIVISION: 0

## 2018-11-21 LAB — TYPE AND SCREEN
ABO/RH(D): B NEG
ANTIBODY SCREEN: POSITIVE
UNIT DIVISION: 0
Unit division: 0

## 2018-11-21 LAB — BPAM RBC
BLOOD PRODUCT EXPIRATION DATE: 201912142359
BLOOD PRODUCT EXPIRATION DATE: 201912162359
UNIT TYPE AND RH: 9500
Unit Type and Rh: 9500

## 2018-11-26 ENCOUNTER — Inpatient Hospital Stay (HOSPITAL_COMMUNITY): Admission: RE | Admit: 2018-11-26 | Payer: BLUE CROSS/BLUE SHIELD | Source: Ambulatory Visit

## 2019-07-22 IMAGING — US US MFM OB FOLLOW-UP
1 series · 13 of 27 positions shown · non-contrast
Comparison: none

[Series 1: us mfm ob follow-up · 27 acquisitions, 13 frames shown]
[im 2/27]
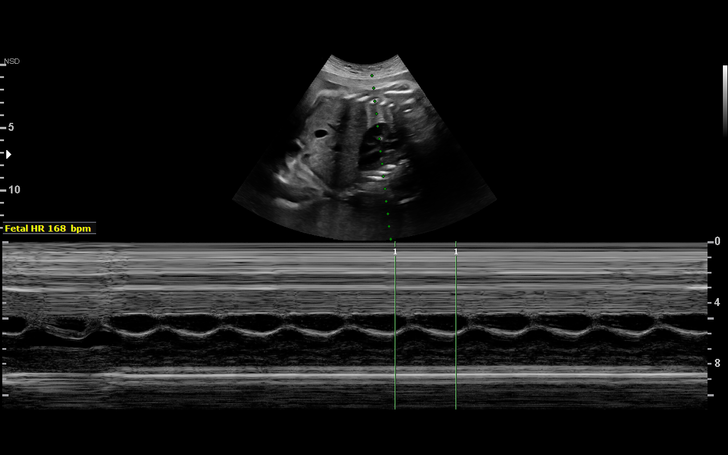
[im 4/27]
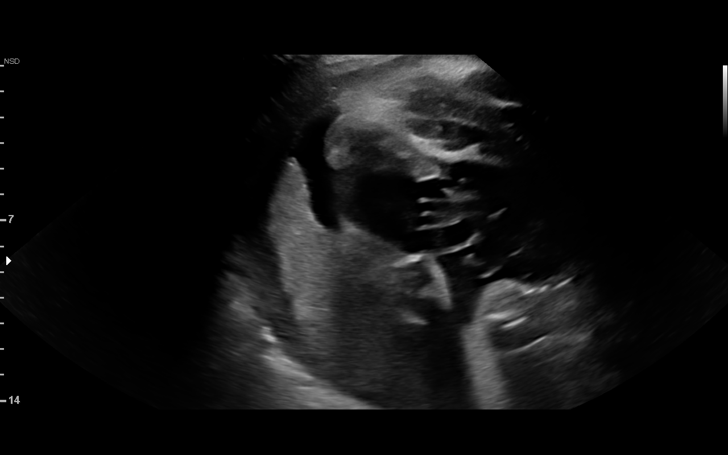
[im 6/27]
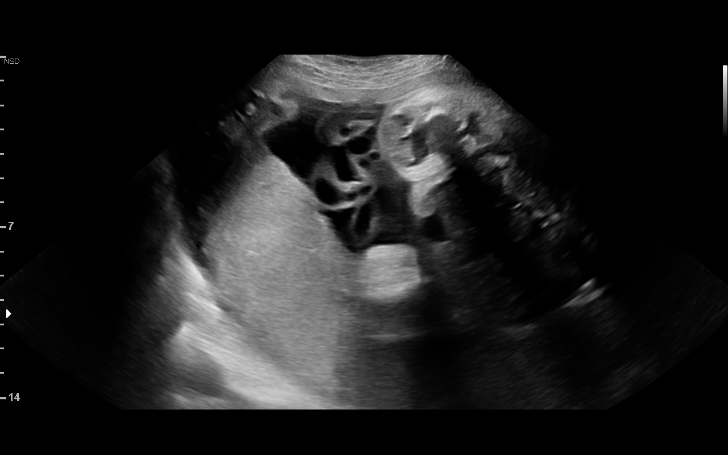
[im 8/27]
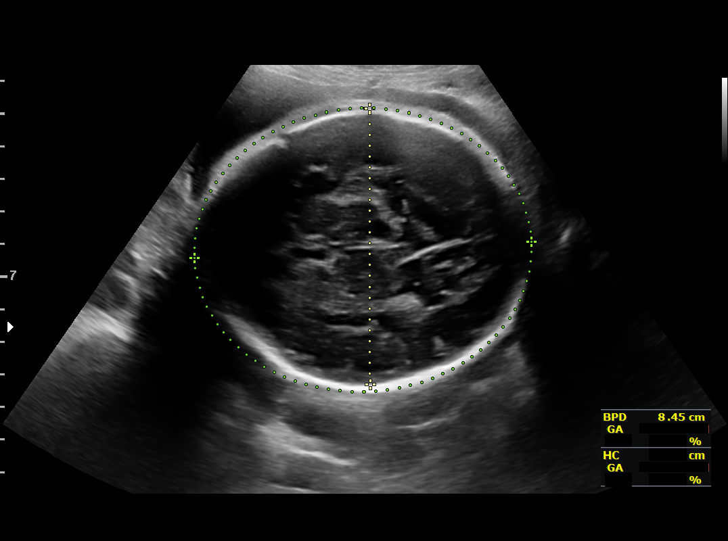
[im 10/27]
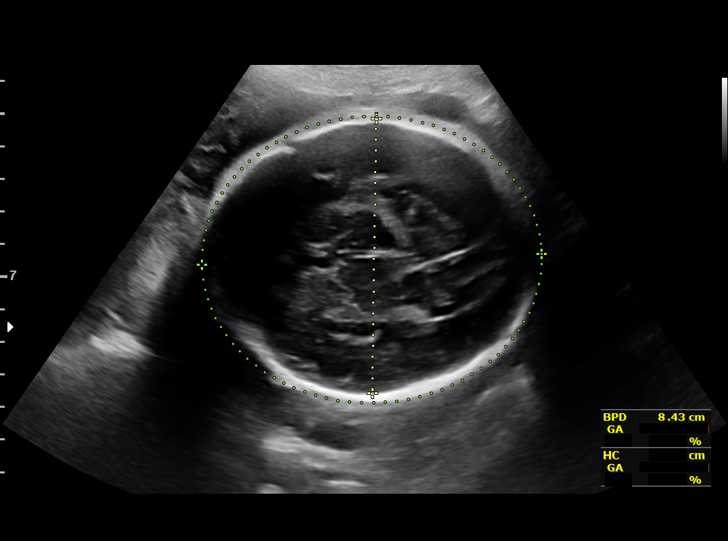
[im 12/27]
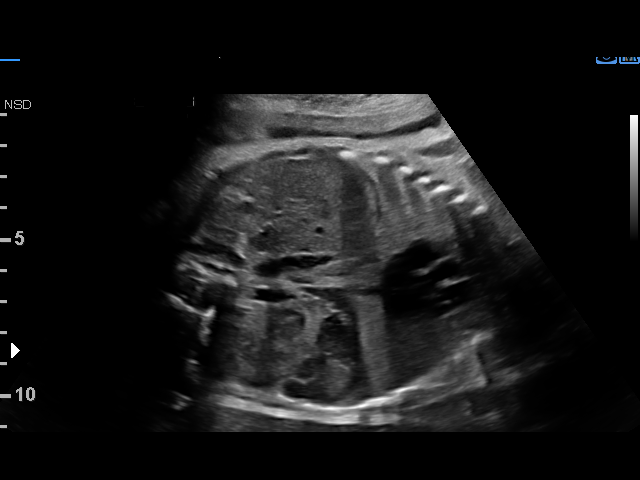
[im 14/27]
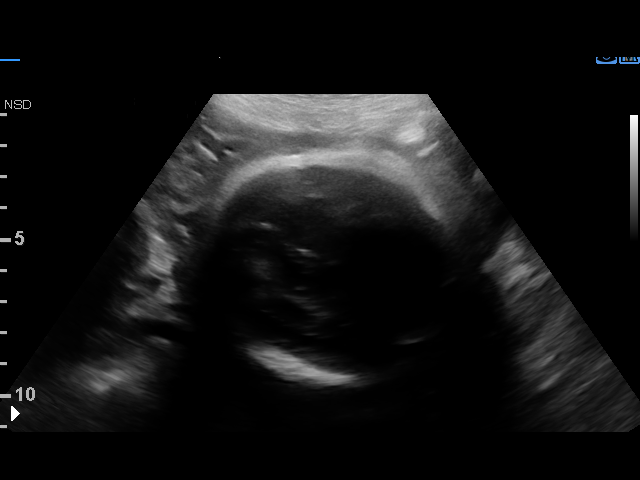
[im 16/27]
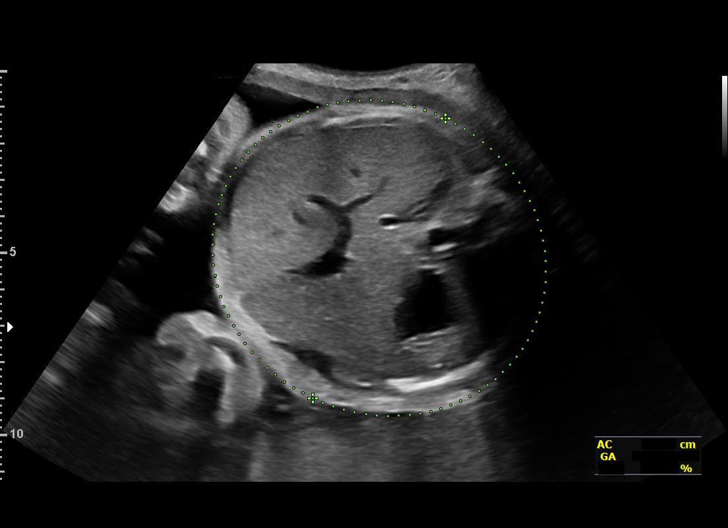
[im 18/27]
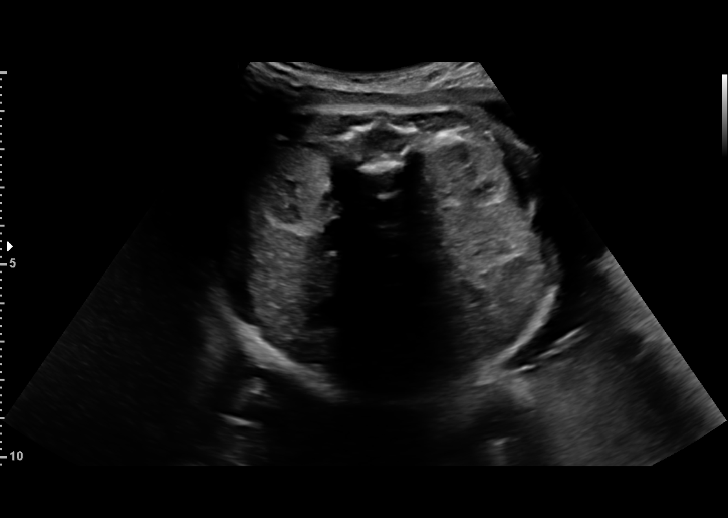
[im 20/27]
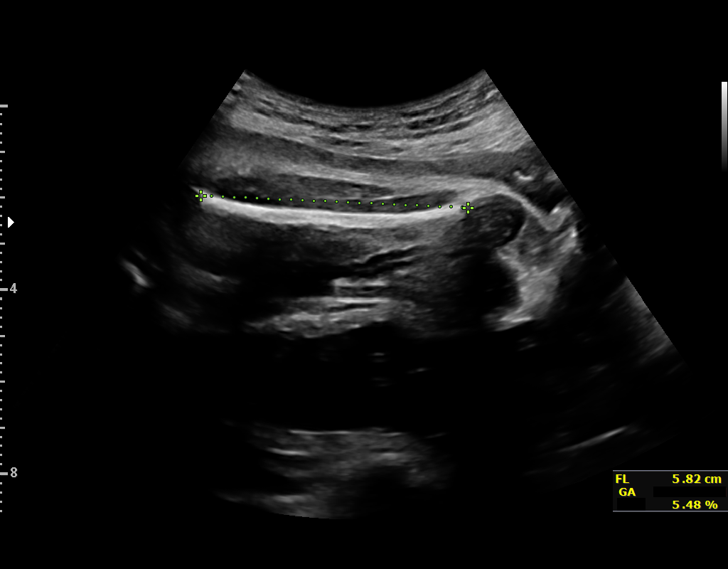
[im 22/27]
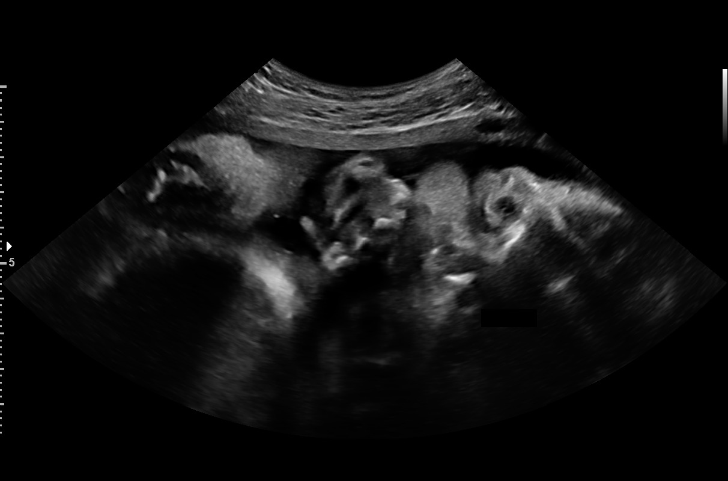
[im 24/27]
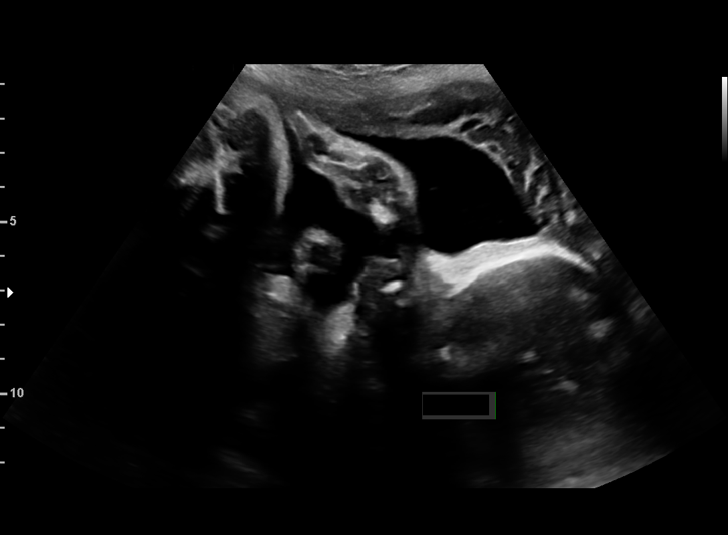
[im 26/27]
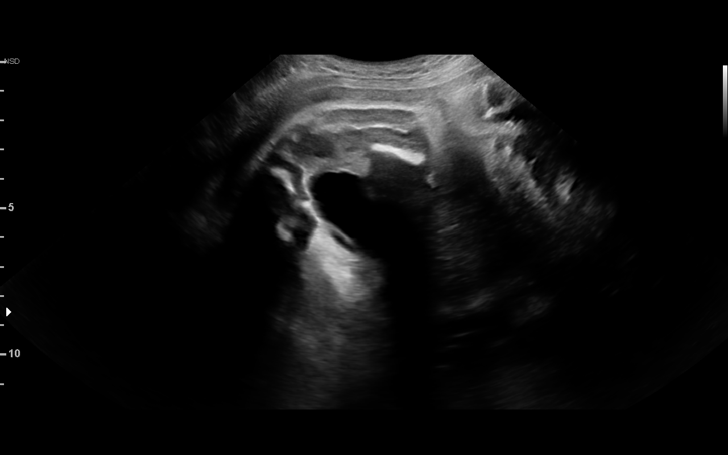

[13 of 27 positions shown; findings below may reference images not displayed]

OB/GYN &
Infertility
[facility] [HOSPITAL]

Indications

32 weeks gestation of pregnancy
Encounter for antenatal screening for
malformations
Advanced maternal age multigravida 35+,
second trimester
Maternal viral disease, antepartum, CMV
Antenatal follow-up for nonvisualized fetal
anatomy
Fetal Evaluation

Num Of Fetuses:         1
Fetal Heart Rate(bpm):  168
Cardiac Activity:       Observed
Presentation:           Cephalic
Placenta:               Posterior
P. Cord Insertion:      Previously Visualized

Amniotic Fluid
AFI FV:      Within normal limits

AFI Sum(cm)     %Tile       Largest Pocket(cm)
15.85           57

RUQ(cm)       RLQ(cm)       LUQ(cm)        LLQ(cm)
3.18
Biophysical Evaluation
Amniotic F.V:   Within normal limits       F. Tone:        Observed
F. Movement:    Observed                   Score:          [DATE]
F. Breathing:   Observed
Biometry

BPD:      84.2  mm     G. Age:  33w 6d         88  %    CI:        77.99   %    70 - 86
FL/HC:      19.3   %    19.1 -
HC:      301.7  mm     G. Age:  33w 3d         49  %    HC/AC:      1.08        0.96 -
AC:      280.3  mm     G. Age:  32w 0d         47  %    FL/BPD:     69.2   %    71 - 87
FL:       58.3  mm     G. Age:  30w 3d          7  %    FL/AC:      20.8   %    20 - 24

LV:        3.7  mm

Est. FW:    3967  gm      4 lb 2 oz     52  %
OB History

Gravidity:    3         Term:   1        Prem:   0        SAB:   1
TOP:          0       Ectopic:  0        Living: 1
Gestational Age

LMP:           32w 1d        Date:  02/22/18                 EDD:   11/29/18
U/S Today:     32w 3d                                        EDD:   11/27/18
Best:          32w 1d     Det. By:  LMP  (02/22/18)          EDD:   11/29/18
Anatomy

Cranium:               Appears normal         LVOT:                   Previously seen
Cavum:                 Previously seen        Aortic Arch:            Previously seen
Ventricles:            Appears normal         Ductal Arch:            Previously seen
Choroid Plexus:        Previously seen        Diaphragm:              Appears normal
Cerebellum:            Previously seen        Stomach:                Appears normal, left
sided
Posterior Fossa:       Previously seen        Abdomen:                Appears normal
Nuchal Fold:           Not applicable (>20    Abdominal Wall:         Previously seen
wks GA)
Face:                  Profile WNL. Orbits    Cord Vessels:           Previously seen
seen prev.
Lips:                  Previously seen        Kidneys:                Appear normal
Palate:                Not well visualized    Bladder:                Appears normal
Thoracic:              Appears normal         Spine:                  Previously seen
Heart:                 Appears normal         Upper Extremities:      Previously seen
(4CH, axis, and
situs)
RVOT:                  Previously seen        Lower Extremities:      Previously seen

Other:  Right hand/5th and left heel previously visualized. Nasal bone
previously visualized. Technically difficult due to fetal position.
Cervix Uterus Adnexa

Cervix
Normal appearance by transabdominal scan.

Left Ovary
Previously seen.
Right Ovary
Previously seen
Comments

Comments:
U/S images reviewed. Findings reviewed with patient.
Appropriate fetal growth is noted.  No fetal abnormalities are
seen. No specific abnormalities associated with CMV
infection (IUGR, intracranial calcifications, hepatic
calcifications or echogenic bowe)l are seen on today's U/S.
No evidence of fetal compromise is found on BPP today.
Patient returns for F/U secondary to acute Primary CMV
infection.  CMV IgM - positive; CMV; original CMV IgG -
negative; convalescent titers CMV IgG.- positive.
Quantitative CMV - 6644.  Discussed the option of
amniocentesis as well as limitations - a positive result
indicates fetal infection but a negative result does not rule out
fetal infection.  Amniocentesis declined.
Questions answered.  Discussed assessment with Dr.
Portia.
15 minutes spent face to face with patient.
Recommendations: 1) Serial U/S every 4 weeks for fetal
growth 2) Weekly BPP 3) Inform Pediatrics at time of
delivery.
Recommendations

1) Serial U/S every 4 weeks for fetal growth 2) Weekly BPP
3) Inform Pediatrics at time of delivery.

## 2021-03-11 ENCOUNTER — Other Ambulatory Visit: Payer: Self-pay

## 2021-03-11 ENCOUNTER — Ambulatory Visit
Admission: RE | Admit: 2021-03-11 | Discharge: 2021-03-11 | Disposition: A | Discharge status: Home / Self Care | Payer: No Typology Code available for payment source | Source: Ambulatory Visit | Attending: Sports Medicine | Admitting: Sports Medicine

## 2021-03-11 VITALS — BP 126/85 | HR 90 | Temp 98.1°F | Resp 18 | Ht 64.0 in | Wt 126.0 lb

## 2021-03-11 DIAGNOSIS — R509 Fever, unspecified: Secondary | ICD-10-CM

## 2021-03-11 DIAGNOSIS — M791 Myalgia, unspecified site: Secondary | ICD-10-CM

## 2021-03-11 LAB — CBC WITH DIFFERENTIAL/PLATELET
Abs Immature Granulocytes: 0.02 10*3/uL (ref 0.00–0.07)
Basophils Absolute: 0.1 10*3/uL (ref 0.0–0.1)
Basophils Relative: 1 %
Eosinophils Absolute: 0.1 10*3/uL (ref 0.0–0.5)
Eosinophils Relative: 1 %
HCT: 40.8 % (ref 36.0–46.0)
Hemoglobin: 13.9 g/dL (ref 12.0–15.0)
Immature Granulocytes: 0 %
Lymphocytes Relative: 24 %
Lymphs Abs: 2.2 10*3/uL (ref 0.7–4.0)
MCH: 30.8 pg (ref 26.0–34.0)
MCHC: 34.1 g/dL (ref 30.0–36.0)
MCV: 90.3 fL (ref 80.0–100.0)
Monocytes Absolute: 0.5 10*3/uL (ref 0.1–1.0)
Monocytes Relative: 6 %
Neutro Abs: 6.1 10*3/uL (ref 1.7–7.7)
Neutrophils Relative %: 68 %
Platelets: 187 10*3/uL (ref 150–400)
RBC: 4.52 MIL/uL (ref 3.87–5.11)
RDW: 12.6 % (ref 11.5–15.5)
WBC: 8.9 10*3/uL (ref 4.0–10.5)
nRBC: 0 % (ref 0.0–0.2)

## 2021-03-11 LAB — URINALYSIS, COMPLETE (UACMP) WITH MICROSCOPIC
Bilirubin Urine: NEGATIVE
Glucose, UA: NEGATIVE mg/dL
Ketones, ur: NEGATIVE mg/dL
Leukocytes,Ua: NEGATIVE
Nitrite: NEGATIVE
Protein, ur: NEGATIVE mg/dL
Specific Gravity, Urine: 1.01 (ref 1.005–1.030)
WBC, UA: NONE SEEN WBC/hpf (ref 0–5)
pH: 6 (ref 5.0–8.0)

## 2021-03-11 LAB — COMPREHENSIVE METABOLIC PANEL
ALT: 18 U/L (ref 0–44)
AST: 24 U/L (ref 15–41)
Albumin: 4.4 g/dL (ref 3.5–5.0)
Alkaline Phosphatase: 48 U/L (ref 38–126)
Anion gap: 11 (ref 5–15)
BUN: 12 mg/dL (ref 6–20)
CO2: 25 mmol/L (ref 22–32)
Calcium: 9.1 mg/dL (ref 8.9–10.3)
Chloride: 99 mmol/L (ref 98–111)
Creatinine, Ser: 0.9 mg/dL (ref 0.44–1.00)
GFR, Estimated: >60 mL/min (ref >60)
Glucose, Bld: 103 mg/dL — ABNORMAL HIGH (ref 70–99)
Potassium: 3.9 mmol/L (ref 3.5–5.1)
Sodium: 135 mmol/L (ref 135–145)
Total Bilirubin: 0.6 mg/dL (ref 0.3–1.2)
Total Protein: 8.1 g/dL (ref 6.5–8.1)

## 2021-03-11 LAB — SEDIMENTATION RATE: Sed Rate: 24 mm/hr — ABNORMAL HIGH (ref 0–20)

## 2021-03-11 LAB — C-REACTIVE PROTEIN: CRP: 5.4 mg/dL — ABNORMAL HIGH

## 2021-03-11 NOTE — ED Triage Notes (Signed)
Pt c/o fever (103 yesterday) and body aches. Pt has tested for COVID and flu and both were negative. 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 Pt denies cough, congestion, n/v/d or other symptoms. No one else in the home is sick.

## 2021-03-11 NOTE — Discharge Instructions (Addendum)
Your labs are pending at the time of discharge. I have included educational handout on fever. Continue with the Tylenol or Motrin. Someone will contact you with your results when they are available.

## 2021-03-11 NOTE — ED Provider Notes (Signed)
[facility]    CSN: [id number] Arrival date & time: 03/11/21  1735      History   Chief Complaint Chief Complaint  Patient presents with  . Fever  . Generalized Body Aches    HPI Tiffany Edwards is a 40 y.o. female.   Patient is a pleasant 40 year old female who presents for evaluation of the above issues.  She reports intermittent fevers for about 3 to 4 days that do respond to Tylenol and/or ibuprofen.  She also reports some myalgia.  She did take a home COVID test yesterday that was negative.  [facility] that was also negative.  There is nobody else at home is sick.  No sick contacts.  No Covid exposure.  She did have COVID at the end of January.  She has been vaccinated against COVID and has received the booster.  She is also gotten her flu shot.  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  She denies any abdominal or urinary symptoms.  Her temperature on arrival is 98.1 but she said that she did have fever reducing medication about 2 hours prior to arrival.  Other than the fever and myalgias she reports no other symptoms.  She denies any chest pain shortness of breath or any other red flag signs or symptoms.     Past Medical History:  Diagnosis Date  . Anemia   . CMV (cytomegalovirus) antibody positive   . Complication of anesthesia    reported tachycardia  . GERD (gastroesophageal reflux disease)   . History of gestational hypertension   . Vaginal Pap smear, abnormal     Patient Active Problem List   Diagnosis Date Noted  . Postpartum care following vaginal delivery (11/23) 11/19/2018  . Term pregnancy 11/17/2018    Past Surgical History:  Procedure Laterality Date  . BREAST LUMPECTOMY Right   . EXTERNAL EAR SURGERY    . LEEP      OB History    Gravida  3   Para  2   Term  2   Preterm      AB  1   Living  2      SAB  1   IAB      Ectopic      Multiple  0   Live Births  2            Home Medications    Prior to Admission medications   Medication Sig Start Date End Date Taking? Authorizing Provider  acetaminophen (TYLENOL) 500 MG tablet Take by mouth. 03/11/21 03/18/21 Yes [provider]  ibuprofen (ADVIL,MOTRIN) 600 MG tablet Take 1 tablet (600 mg total) by mouth every 6 (six) hours. 11/19/18  Yes Artelia Laroche., CNM  IRON PO Take 1 tablet by mouth daily.    Yes [provider]  Magnesium 200 MG TABS Take 400 mg by mouth daily.   Yes [provider]  Prenatal Vit-Fe Fumarate-FA (PRENATAL MULTIVITAMIN) TABS tablet Take 1 tablet by mouth daily at 12 noon.   Yes [provider]    Family History Family History  Problem Relation Age of Onset  . Hypertension Father   . Heart disease Maternal Grandmother   . COPD Maternal Grandmother   . Diabetes Maternal Grandfather   . Heart attack Paternal Grandfather   . Diabetes Paternal Grandfather   .  Birth defects Cousin        cleft lip and palate    Social History Social History   Tobacco Use  . Smoking status: Never Smoker  . Smokeless tobacco: Never Used  Vaping Use  . Vaping Use: Never used  Substance Use Topics  . Alcohol use: No  . Drug use: No     Allergies   Patient has no known allergies.   Review of Systems Review of Systems  Constitutional: Positive for fever. Negative for activity change, appetite change, chills, diaphoresis and fatigue.  HENT: Negative.  Negative for congestion, ear pain, postnasal drip, rhinorrhea, sinus pressure, sinus pain, sneezing and sore throat.   Eyes: Negative.  Negative for pain.  Respiratory: Negative.  Negative for cough, choking, chest tightness, shortness of breath, wheezing and stridor.   Cardiovascular: Negative.  Negative for chest pain and palpitations.  Gastrointestinal: Negative.  Negative for abdominal pain, constipation, diarrhea,  nausea and vomiting.  Genitourinary: Negative.  Negative for dysuria, flank pain, frequency, hematuria, pelvic pain, urgency, vaginal bleeding, vaginal discharge and vaginal pain.  Musculoskeletal: Positive for myalgias.  Skin: Negative.  Negative for color change, pallor, rash and wound.  Neurological: Negative.  Negative for dizziness, seizures, syncope, speech difficulty, weakness, light-headedness, numbness and headaches.  All other systems reviewed and are negative.    Physical Exam Triage Vital Signs ED Triage Vitals  Enc Vitals Group     BP 03/11/21 1824 126/85     Pulse Rate 03/11/21 1824 90     Resp 03/11/21 1824 18     Temp 03/11/21 1824 98.1 F (36.7 C)     Temp Source 03/11/21 1824 Oral     SpO2 03/11/21 1824 99 %     Weight 03/11/21 1822 126 lb (57.2 kg)     Height 03/11/21 1822 5' 4"  (1.626 m)     Head Circumference --      Peak Flow --      Pain Score 03/11/21 1822 2     Pain Loc --      Pain Edu? --      Excl. in [place]? --    No data found.  Updated Vital Signs BP 126/85 (BP Location: Left Arm)   Pulse 90   Temp 98.1 F (36.7 C) (Oral)   Resp 18   Ht 5' 4"  (1.626 m)   Wt 57.2 kg   LMP 02/26/2021 (Approximate)   SpO2 99%   BMI 21.63 kg/m   Visual Acuity Right Eye Distance:   Left Eye Distance:   Bilateral Distance:    Right Eye Near:   Left Eye Near:    Bilateral Near:     Physical Exam Vitals and nursing note reviewed.  Constitutional:      General: She is not in acute distress.    Appearance: Normal appearance. She is not ill-appearing, toxic-appearing or diaphoretic.  HENT:     Head: Normocephalic and atraumatic.     Right Ear: Tympanic membrane normal.     Left Ear: Tympanic membrane normal.     Nose: Nose normal. No congestion or rhinorrhea.     Mouth/Throat:     Mouth: Mucous membranes are moist.     Pharynx: Oropharynx is clear. No oropharyngeal exudate or posterior oropharyngeal erythema.  Eyes:     General: No scleral icterus.        Right eye: No discharge.        Left eye: No discharge.     Extraocular Movements:  Extraocular movements intact.     Conjunctiva/sclera: Conjunctivae normal.     Pupils: Pupils are equal, round, and reactive to light.  Cardiovascular:     Rate and Rhythm: Normal rate and regular rhythm.     Pulses: Normal pulses.     Heart sounds: Normal heart sounds. No murmur heard. No friction rub. No gallop.   Pulmonary:     Effort: Pulmonary effort is normal. No respiratory distress.     Breath sounds: Normal breath sounds. No stridor. No wheezing, rhonchi or rales.  Abdominal:     General: Bowel sounds are normal. There is no distension.     Palpations: Abdomen is soft.     Tenderness: There is no abdominal tenderness. There is no right CVA tenderness, left CVA tenderness, guarding or rebound.  Musculoskeletal:        General: Normal range of motion.     Cervical back: Normal range of motion and neck supple. No rigidity or tenderness.  Lymphadenopathy:     Cervical: No cervical adenopathy.  Skin:    General: Skin is warm and dry.     Capillary Refill: Capillary refill takes less than 2 seconds.     Findings: No erythema, lesion or rash.  Neurological:     General: No focal deficit present.     Mental Status: She is alert and oriented to person, place, and time.      UC Treatments / Results  Labs (all labs ordered are listed, but only abnormal results are displayed) Labs Reviewed  CBC WITH DIFFERENTIAL/PLATELET  SEDIMENTATION RATE  COMPREHENSIVE METABOLIC PANEL  URINALYSIS, COMPLETE (UACMP) WITH MICROSCOPIC  C-REACTIVE PROTEIN    EKG   Radiology No results found.  Procedures Procedures (including critical care time)  Medications Ordered in UC Medications - No data to display  Initial Impression / Assessment and Plan / UC Course  I have reviewed the triage vital signs and the nursing notes.  Pertinent labs & imaging results that were available during my care of the  patient were reviewed by me and considered in my medical decision making (see chart for details).  Clinical impression: 3 to 4 days of intermittent fevers that do respond to fever reducing medication.  She has had a negative Covid test and negative flu test.  No urinary or abdominal symptoms.  Treatment plan: 1.  The findings and treatment plan were discussed in detail with the patient.  Patient was in agreement. 2.  Indicated that even if she did have a reactivation of CMV there was no real test for [place] to do it.  Some viral process that will need to run its course.  She voiced verbal understanding. 3.  I offered to do some blood work and check a urine just to reassure her.  She did want to do that.  Her UA did not show a UTI.  Her CBC did not show any elevation in white count.  H&H was normal.  Her c-Met did not show any electrolyte abnormality.  Normal renal and liver function.  Her glucose was mildly elevated at 103.  Her CRP and ESR were pending at the time of discharge.  I did not feel that would change her management.  I only drew them to have a baseline in case her fever persisted. 4.  If her ESR and CRP are elevated she may need to see rheumatology.  That referral will need to come from her primary care provider. 5.  Educational handouts were provided. 6.  Just continue with plenty of rest, plenty of fluids, over-the-counter meds as needed, Tylenol or Motrin for fever control.  Hopefully the viral process will improve quickly and her fevers will subside. 7.  If symptoms were to worsen in any way she knows to go to the ER.  Otherwise follow-up with her primary care provider. 8.  Follow-up here as needed.    Final Clinical Impressions(s) / UC Diagnoses   Final diagnoses:  Fever, unspecified fever cause     Discharge Instructions     Your labs are pending at the time of discharge. I have included educational handout on fever. Continue with the Tylenol or Motrin. Someone will contact  you with your results when they are available.    ED Prescriptions    None     PDMP not reviewed this encounter.   Verda Cumins, MD 03/14/21 2137

## 2022-11-19 ENCOUNTER — Ambulatory Visit
Admission: EM | Admit: 2022-11-19 | Discharge: 2022-11-19 | Disposition: A | Discharge status: Home / Self Care | Payer: No Typology Code available for payment source | Attending: Physician Assistant | Admitting: Physician Assistant

## 2022-11-19 ENCOUNTER — Telehealth: Payer: Self-pay | Admitting: Physician Assistant

## 2022-11-19 ENCOUNTER — Encounter: Payer: Self-pay | Admitting: Emergency Medicine

## 2022-11-19 DIAGNOSIS — J101 Influenza due to other identified influenza virus with other respiratory manifestations: Secondary | ICD-10-CM

## 2022-11-19 DIAGNOSIS — Z79899 Other long term (current) drug therapy: Secondary | ICD-10-CM | POA: Insufficient documentation

## 2022-11-19 DIAGNOSIS — Z1152 Encounter for screening for COVID-19: Secondary | ICD-10-CM | POA: Insufficient documentation

## 2022-11-19 DIAGNOSIS — J069 Acute upper respiratory infection, unspecified: Secondary | ICD-10-CM | POA: Diagnosis not present

## 2022-11-19 DIAGNOSIS — R051 Acute cough: Secondary | ICD-10-CM | POA: Insufficient documentation

## 2022-11-19 DIAGNOSIS — R509 Fever, unspecified: Secondary | ICD-10-CM | POA: Insufficient documentation

## 2022-11-19 LAB — RESP PANEL BY RT-PCR (FLU A&B, COVID) ARPGX2
Influenza A by PCR: POSITIVE — AB
Influenza B by PCR: NEGATIVE
SARS Coronavirus 2 by RT PCR: NEGATIVE

## 2022-11-19 MED ORDER — OSELTAMIVIR PHOSPHATE 75 MG PO CAPS: 75.0000 mg | ORAL_CAPSULE | Freq: Two times a day (BID) | ORAL | 0 refills | Status: AC

## 2022-11-19 MED ORDER — PSEUDOEPH-BROMPHEN-DM 30-2-10 MG/5ML PO SYRP: 10.0000 mL | ORAL_SOLUTION | Freq: Four times a day (QID) | ORAL | 0 refills | Status: AC | PRN

## 2022-11-19 NOTE — ED Triage Notes (Signed)
Patient c/o cough, bodycahes, fever, and congestion for over a week ago.  Patient states that cough has gotten worse.

## 2022-11-19 NOTE — ED Provider Notes (Signed)
[facility]    CSN: [id number] Arrival date & time: 11/19/22  1139      History   Chief Complaint Chief Complaint  Patient presents with   Fever   Cough    HPI Tiffany Edwards is a 41 y.o. female presenting for her 10-day history of productive cough, body aches, nasal congestion, sinus pressure.  Patient reports she has had a fever up to 103 degrees for the past 2 days.  Reports worsening with the cough as well.  No complaint of sore throat, nausea/vomiting, breathing difficulty.  No sick contacts.  COVID test at home was negative.  Taking OTC medication for symptoms and fever.  No other complaints.  HPI  Past Medical History:  Diagnosis Date   Anemia    CMV (cytomegalovirus) antibody positive    Complication of anesthesia    reported tachycardia   GERD (gastroesophageal reflux disease)    History of gestational hypertension    Vaginal Pap smear, abnormal     Patient Active Problem List   Diagnosis Date Noted   Postpartum care following vaginal delivery (11/23) 11/19/2018   Term pregnancy 11/17/2018    Past Surgical History:  Procedure Laterality Date   BREAST LUMPECTOMY Right    EXTERNAL EAR SURGERY     LEEP      OB History     Gravida  3   Para  2   Term  2   Preterm      AB  1   Living  2      SAB  1   IAB      Ectopic      Multiple  0   Live Births  2            Home Medications    Prior to Admission medications   Medication Sig Start Date End Date Taking? Authorizing Provider  brompheniramine-pseudoephedrine-DM 30-2-10 MG/5ML syrup Take 10 mLs by mouth 4 (four) times daily as needed for up to 7 days. 11/19/22 11/26/22 Yes Shirlee Latch, PA-C  Magnesium 200 MG TABS Take 400 mg by mouth daily.   Yes [provider]  ibuprofen (ADVIL,MOTRIN) 600 MG tablet Take 1 tablet (600 mg total) by mouth every 6 (six) hours. 11/19/18   Marlinda Mike, CNM  IRON PO Take 1 tablet by mouth daily.     [provider]  Prenatal Vit-Fe Fumarate-FA (PRENATAL MULTIVITAMIN) TABS tablet Take 1 tablet by mouth daily at 12 noon.    [provider]    Family History Family History  Problem Relation Age of Onset   Hypertension Father    Heart disease Maternal Grandmother    COPD Maternal Grandmother    Diabetes Maternal Grandfather    Heart attack Paternal Grandfather    Diabetes Paternal Grandfather    Birth defects Cousin        cleft lip and palate    Social History Social History   Tobacco Use   Smoking status: Never   Smokeless tobacco: Never  Vaping Use   Vaping Use: Never used  Substance Use Topics   Alcohol use: No   Drug use: No     Allergies   Patient has no known allergies.   Review of Systems Review of Systems  Constitutional:  Positive for fatigue and fever. Negative for chills and diaphoresis.  HENT:  Positive for congestion, rhinorrhea and sinus pressure. Negative for ear pain and sore throat.   Respiratory:  Positive for cough.  Negative for shortness of breath.   Cardiovascular:  Negative for chest pain.  Gastrointestinal:  Negative for abdominal pain, nausea and vomiting.  Musculoskeletal:  Positive for myalgias. Negative for arthralgias.  Skin:  Negative for rash.  Neurological:  Negative for weakness and headaches.  Hematological:  Negative for adenopathy.     Physical Exam Triage Vital Signs ED Triage Vitals  Enc Vitals Group     BP 11/19/22 1159 134/89     Pulse Rate 11/19/22 1159 (!) 105     Resp 11/19/22 1159 14     Temp 11/19/22 1159 97.9 F (36.6 C)     Temp Source 11/19/22 1159 Oral     SpO2 11/19/22 1159 95 %     Weight 11/19/22 1157 125 lb (56.7 kg)     Height 11/19/22 1157 5\' 4"  (1.626 m)     Head Circumference --      Peak Flow --      Pain Score 11/19/22 1157 6     Pain Loc --      Pain Edu? --      Excl. in [place]? --    No data found.  Updated Vital Signs BP 134/89 (BP Location: Right Arm)   Pulse (!) 105   Temp 97.9 F (36.6  C) (Oral)   Resp 14   Ht 5\' 4"  (1.626 m)   Wt 125 lb (56.7 kg)   LMP 11/05/2022 (Approximate)   SpO2 95%   Breastfeeding No   BMI 21.46 kg/m       Physical Exam Vitals and nursing note reviewed.  Constitutional:      General: She is not in acute distress.    Appearance: Normal appearance. She is not ill-appearing or toxic-appearing.  HENT:     Head: Normocephalic and atraumatic.     Right Ear: Tympanic membrane, ear canal and external ear normal.     Left Ear: Tympanic membrane, ear canal and external ear normal.     Nose: Congestion present.     Mouth/Throat:     Mouth: Mucous membranes are moist.     Pharynx: Oropharynx is clear.  Eyes:     General: No scleral icterus.       Right eye: No discharge.        Left eye: No discharge.     Conjunctiva/sclera: Conjunctivae normal.  Cardiovascular:     Rate and Rhythm: Regular rhythm. Tachycardia present.     Heart sounds: Normal heart sounds.  Pulmonary:     Effort: Pulmonary effort is normal. No respiratory distress.     Breath sounds: Normal breath sounds. No wheezing, rhonchi or rales.  Musculoskeletal:     Cervical back: Neck supple.  Skin:    General: Skin is dry.  Neurological:     General: No focal deficit present.     Mental Status: She is alert. Mental status is at baseline.     Motor: No weakness.     Gait: Gait normal.  Psychiatric:        Mood and Affect: Mood normal.        Behavior: Behavior normal.        Thought Content: Thought content normal.      UC Treatments / Results  Labs (all labs ordered are listed, but only abnormal results are displayed) Labs Reviewed  RESP PANEL BY RT-PCR (FLU A&B, COVID) [id number]    EKG   Radiology No results found.  Procedures Procedures (including critical care time)  Medications  Ordered in UC Medications - No data to display  Initial Impression / Assessment and Plan / UC Course  I have reviewed the triage vital signs and the nursing notes.  Pertinent  labs & imaging results that were available during my care of the patient were reviewed by me and considered in my medical decision making (see chart for details).   41 year old female presenting for 10-day history of cough, congestion.  New fever up to 103 degrees over the past 2 days.  Negative home COVID test.  No complaint of breathing difficulty or wheezing.  Vitals are stable.  She is overall well-appearing.  In no acute distress.  On exam she has nasal congestion.  Throat is clear.  Chest clear to auscultation.  Respiratory panel obtained.  Positive influenza A.  Call discussed result with patient.  Sent Tamiflu.  Advised plenty of rest and fluids.  Cough medication was sent to the pharmacy for her previously.  Reviewed return precautions.   Final Clinical Impressions(s) / UC Diagnoses   Final diagnoses:  Upper respiratory tract infection, unspecified type  Acute cough  Fever, unspecified     Discharge Instructions      -I will call with the results of your respiratory panel.  If everything is negative here I will send antibiotics as you may have developed a secondary sinus infection.  If you are positive for the flu I can send Tamiflu. - I have sent cough medication.  Increase rest and fluid intake.  Follow-up as needed.     ED Prescriptions     Medication Sig Dispense Auth. Provider   brompheniramine-pseudoephedrine-DM 30-2-10 MG/5ML syrup Take 10 mLs by mouth 4 (four) times daily as needed for up to 7 days. 150 mL Shirlee Latch, PA-C      PDMP not reviewed this encounter.   Shirlee Latch, PA-C 11/19/22 1627

## 2022-11-19 NOTE — Discharge Instructions (Signed)
-  I will call with the results of your respiratory panel.  If everything is negative here I will send antibiotics as you may have developed a secondary sinus infection.  If you are positive for the flu I can send Tamiflu. - I have sent cough medication.  Increase rest and fluid intake.  Follow-up as needed.

## 2022-11-19 NOTE — Telephone Encounter (Signed)
Positive influenza A.  Call discussed results with patient.  She is interested in Tamiflu.  Sent to pharmacy.
